# Patient Record
Sex: Female | Born: 1970 | Race: White | Hispanic: No | Smoking: Current some day smoker
Health system: Southern US, Community
[De-identification: ages and names within clinical notes are randomized; demographics above are authoritative.]

## PROBLEM LIST (undated history)

## (undated) DIAGNOSIS — F419 Anxiety disorder, unspecified: Secondary | ICD-10-CM

## (undated) DIAGNOSIS — N9089 Other specified noninflammatory disorders of vulva and perineum: Secondary | ICD-10-CM

## (undated) DIAGNOSIS — K5900 Constipation, unspecified: Secondary | ICD-10-CM

## (undated) DIAGNOSIS — E049 Nontoxic goiter, unspecified: Secondary | ICD-10-CM

## (undated) DIAGNOSIS — E282 Polycystic ovarian syndrome: Secondary | ICD-10-CM

## (undated) DIAGNOSIS — E119 Type 2 diabetes mellitus without complications: Secondary | ICD-10-CM

## (undated) DIAGNOSIS — I10 Essential (primary) hypertension: Secondary | ICD-10-CM

## (undated) HISTORY — DX: Other specified noninflammatory disorders of vulva and perineum: N90.89

## (undated) HISTORY — DX: Essential (primary) hypertension: I10

## (undated) HISTORY — DX: Anxiety disorder, unspecified: F41.9

## (undated) HISTORY — DX: Constipation, unspecified: K59.00

## (undated) HISTORY — DX: Nontoxic goiter, unspecified: E04.9

## (undated) HISTORY — PX: CHOLECYSTECTOMY: SHX55

## (undated) HISTORY — PX: COLONOSCOPY: SHX174

## (undated) HISTORY — PX: OTHER SURGICAL HISTORY: SHX169

## (undated) HISTORY — DX: Type 2 diabetes mellitus without complications: E11.9

---

## 2003-06-26 ENCOUNTER — Other Ambulatory Visit: Admission: RE | Admit: 2003-06-26 | Discharge: 2003-06-26 | Payer: Self-pay | Admitting: Family Medicine

## 2004-11-19 ENCOUNTER — Ambulatory Visit (HOSPITAL_COMMUNITY): Admission: RE | Admit: 2004-11-19 | Discharge: 2004-11-19 | Payer: Self-pay | Admitting: Endocrinology

## 2006-01-09 ENCOUNTER — Ambulatory Visit (HOSPITAL_COMMUNITY): Admission: RE | Admit: 2006-01-09 | Discharge: 2006-01-09 | Payer: Self-pay | Admitting: Endocrinology

## 2006-02-26 ENCOUNTER — Other Ambulatory Visit: Admission: RE | Admit: 2006-02-26 | Discharge: 2006-02-26 | Payer: Self-pay | Admitting: Family Medicine

## 2012-01-10 ENCOUNTER — Emergency Department (HOSPITAL_COMMUNITY)
Admission: EM | Admit: 2012-01-10 | Discharge: 2012-01-10 | Disposition: A | Discharge status: Home / Self Care | Payer: Self-pay | Attending: Emergency Medicine | Admitting: Emergency Medicine

## 2012-01-10 ENCOUNTER — Encounter (HOSPITAL_COMMUNITY): Payer: Self-pay

## 2012-01-10 DIAGNOSIS — H579 Unspecified disorder of eye and adnexa: Secondary | ICD-10-CM | POA: Insufficient documentation

## 2012-01-10 DIAGNOSIS — R220 Localized swelling, mass and lump, head: Secondary | ICD-10-CM | POA: Insufficient documentation

## 2012-01-10 DIAGNOSIS — R599 Enlarged lymph nodes, unspecified: Secondary | ICD-10-CM | POA: Insufficient documentation

## 2012-01-10 DIAGNOSIS — J329 Chronic sinusitis, unspecified: Secondary | ICD-10-CM | POA: Insufficient documentation

## 2012-01-10 DIAGNOSIS — J3489 Other specified disorders of nose and nasal sinuses: Secondary | ICD-10-CM | POA: Insufficient documentation

## 2012-01-10 DIAGNOSIS — H109 Unspecified conjunctivitis: Secondary | ICD-10-CM | POA: Insufficient documentation

## 2012-01-10 DIAGNOSIS — R221 Localized swelling, mass and lump, neck: Secondary | ICD-10-CM | POA: Insufficient documentation

## 2012-01-10 DIAGNOSIS — F17200 Nicotine dependence, unspecified, uncomplicated: Secondary | ICD-10-CM | POA: Insufficient documentation

## 2012-01-10 DIAGNOSIS — R21 Rash and other nonspecific skin eruption: Secondary | ICD-10-CM | POA: Insufficient documentation

## 2012-01-10 HISTORY — DX: Polycystic ovarian syndrome: E28.2

## 2012-01-10 MED ORDER — TOBRAMYCIN 0.3 % OP SOLN
2.0000 [drp] | Freq: Once | OPHTHALMIC | Status: AC
  Administered 2012-01-10: 2 [drp] via OPHTHALMIC
  Filled 2012-01-10: qty 5

## 2012-01-10 MED ORDER — TETRACAINE HCL 0.5 % OP SOLN
OPHTHALMIC | Status: AC
  Filled 2012-01-10: qty 2

## 2012-01-10 MED ORDER — AZITHROMYCIN 250 MG PO TABS: ORAL_TABLET | ORAL | Status: DC

## 2012-01-10 MED ORDER — HYDROCODONE-ACETAMINOPHEN 5-325 MG PO TABS: ORAL_TABLET | ORAL | Status: AC

## 2012-01-10 MED ORDER — TETRACAINE HCL 0.5 % OP SOLN
2.0000 [drp] | Freq: Once | OPHTHALMIC | Status: AC
  Administered 2012-01-10: 2 [drp] via OPHTHALMIC

## 2012-01-10 NOTE — ED Provider Notes (Signed)
History     CSN: [id number]  Arrival date & time 01/10/12  1620   First MD Initiated Contact with Patient 01/10/12 1639      Chief Complaint  Patient presents with  . Wound Check  . Sinusitis  . Eye Problem    (Consider location/radiation/quality/duration/timing/severity/associated sxs/prior treatment) Patient is a 41 y.o. female presenting with sinusitis. The history is provided by the patient. No language interpreter was used.  Sinusitis  This is a new problem. The current episode started more than 2 days ago. The problem has not changed since onset.There has been no fever. The pain is moderate. The pain has been constant since onset. Associated symptoms include congestion, sinus pressure and swollen glands. Pertinent negatives include no chills, no ear pain, no hoarse voice, no sore throat, no cough and no shortness of breath. Treatments tried: sudafed, sulfa antibiotic. The treatment provided no relief.    Past Medical History  Diagnosis Date  . Polycystic ovarian disease     Past Surgical History  Procedure Date  . Cholecystectomy     History reviewed. No pertinent family history.  History  Substance Use Topics  . Smoking status: Current Everyday Smoker    Types: Cigarettes  . Smokeless tobacco: Not on file  . Alcohol Use: No    OB History    Grav Para Term Preterm Abortions TAB SAB Ect Mult Living                  Review of Systems  Constitutional: Negative for chills, activity change and appetite change.  HENT: Positive for congestion and sinus pressure. Negative for ear pain, sore throat, hoarse voice, facial swelling, neck pain and neck stiffness.   Eyes: Positive for discharge, redness and itching.  Respiratory: Negative for cough, chest tightness and shortness of breath.   Gastrointestinal: Negative for nausea, vomiting, abdominal pain and diarrhea.  Genitourinary: Negative for difficulty urinating.  Skin: Positive for rash.  Neurological: Negative  for dizziness, weakness and numbness.  Hematological: Negative for adenopathy.  All other systems reviewed and are negative.    Allergies  Bactrim ds  Home Medications   Current Outpatient Rx  Name Route Sig Dispense Refill  . IBUPROFEN 200 MG PO TABS Oral Take 800 mg by mouth every 6 (six) hours as needed. pain    . SULFAMETHOXAZOLE-TMP DS 800-160 MG PO TABS Oral Take 1 tablet by mouth 2 (two) times daily.      BP 124/86  Pulse 88  Temp(Src) 98.1 F (36.7 C) (Oral)  Resp 18  Ht 5\' 3"  (1.6 m)  Wt 213 lb (96.616 kg)  BMI 37.73 kg/m2  SpO2 100%  LMP 11/09/2011  Physical Exam  Nursing note and vitals reviewed. Constitutional: She is oriented to person, place, and time. She appears well-developed and well-nourished. No distress.  HENT:  Head: Normocephalic and atraumatic. No trismus in the jaw.  Right Ear: Tympanic membrane and ear canal normal.  Left Ear: Tympanic membrane and ear canal normal.  Nose: Mucosal edema present. Right sinus exhibits frontal sinus tenderness. Left sinus exhibits maxillary sinus tenderness and frontal sinus tenderness.  Mouth/Throat: Uvula is midline, oropharynx is clear and moist and mucous membranes are normal. No uvula swelling.  Eyes: Conjunctivae and EOM are normal. Pupils are equal, round, and reactive to light. Right eye exhibits no chemosis and no exudate. Left eye exhibits exudate. Left eye exhibits no chemosis and no hordeolum. No foreign body present in the left eye. No scleral icterus.  Neck: Normal range of motion and phonation normal. Neck supple. No spinous process tenderness and no muscular tenderness present. No Brudzinski's sign and no Kernig's sign noted.  Cardiovascular: Normal rate, regular rhythm, normal heart sounds and intact distal pulses.   No murmur heard. Pulmonary/Chest: Effort normal and breath sounds normal.  Musculoskeletal: She exhibits no edema and no tenderness.  Lymphadenopathy:    She has no cervical adenopathy.    Neurological: She is alert and oriented to person, place, and time. She exhibits normal muscle tone. Coordination normal.  Skin: Skin is warm and intact. Rash noted. Rash is maculopapular.          Three circular, erythematous, target type lesions.  Two present on the right upper arm and one to the upper abdomen.  No vesicles, drainage or surrounding erythema    ED Course  Procedures (including critical care time)       MDM     Vitals are stable. Patient is nontoxic appearing, Alert and appears comfortable. 3 target type lesions to the trunk and right upper extremity are likely related to sulfa antibiotic.   Differential diagnosis would include erythema multiforma.  I have advised patient to discontinue the Bactrim, she agrees to close followup with her primary care physician or to return here for worsening symptoms.  Patient / Family / Caregiver understand and agree with initial ED impression and plan with expectations set for ED visit. Pt stable in ED with no significant deterioration in condition.      Bartlomiej Jenkinson L. Jodiann Ognibene, [place] 01/13/12 1528

## 2012-01-10 NOTE — ED Notes (Signed)
1. Has 3 ?bites to ab area and arm  first noted this am 2. Had sinus pain earlier this week, now having headache from sinus pressure, taking someone elses antibiotic 3.left eye swollen for 2 days, thinks may be "pink eye"

## 2012-01-10 NOTE — ED Notes (Signed)
Pt a/ox4. Resp even and unlabored. NAD at this time. D/C instructions reviewed with pt. Pt verbalized understanding. Pt ambulated to lobby with steady gate.  

## 2012-01-13 NOTE — ED Provider Notes (Signed)
Medical screening examination/treatment/procedure(s) were performed by non-physician practitioner and as supervising physician I was immediately available for consultation/collaboration.  Tanise Russman, MD 01/13/12 2119 

## 2012-02-04 ENCOUNTER — Other Ambulatory Visit (HOSPITAL_COMMUNITY): Payer: Self-pay | Admitting: Family Medicine

## 2012-02-04 DIAGNOSIS — E049 Nontoxic goiter, unspecified: Secondary | ICD-10-CM

## 2012-02-12 ENCOUNTER — Other Ambulatory Visit (HOSPITAL_COMMUNITY): Payer: Self-pay

## 2013-03-29 ENCOUNTER — Encounter: Payer: Self-pay | Admitting: *Deleted

## 2013-03-29 DIAGNOSIS — F419 Anxiety disorder, unspecified: Secondary | ICD-10-CM | POA: Insufficient documentation

## 2013-06-07 ENCOUNTER — Ambulatory Visit (INDEPENDENT_AMBULATORY_CARE_PROVIDER_SITE_OTHER): Payer: Medicaid Other | Admitting: Adult Health

## 2013-06-07 ENCOUNTER — Encounter: Payer: Self-pay | Admitting: Adult Health

## 2013-06-07 VITALS — BP 142/96 | Ht 64.0 in | Wt 201.0 lb

## 2013-06-07 DIAGNOSIS — Z3200 Encounter for pregnancy test, result unknown: Secondary | ICD-10-CM

## 2013-06-07 DIAGNOSIS — Z3201 Encounter for pregnancy test, result positive: Secondary | ICD-10-CM

## 2013-06-07 LAB — POCT URINE PREGNANCY: Preg Test, Ur: POSITIVE

## 2013-06-20 ENCOUNTER — Encounter: Payer: Self-pay | Admitting: Adult Health

## 2013-06-20 ENCOUNTER — Ambulatory Visit (INDEPENDENT_AMBULATORY_CARE_PROVIDER_SITE_OTHER): Payer: Medicaid Other | Admitting: Adult Health

## 2013-06-20 VITALS — BP 136/90 | Ht 64.0 in | Wt 207.0 lb

## 2013-06-20 DIAGNOSIS — Z3202 Encounter for pregnancy test, result negative: Secondary | ICD-10-CM

## 2013-06-20 DIAGNOSIS — O039 Complete or unspecified spontaneous abortion without complication: Secondary | ICD-10-CM

## 2013-06-20 LAB — POCT URINE PREGNANCY: Preg Test, Ur: NEGATIVE

## 2013-06-20 NOTE — Progress Notes (Signed)
Subjective:     Patient ID: Catherine Graves, female   DOB: 22-Dec-1970, 42 y.o.   MRN: [id number]  HPI Garnell is a 42 year old white female in complaining of bleeding that started Saturday and continues some now, some cramps.She had a + pregnancy test a few weeks ago.  Review of Systems See HPI Reviewed past medical,surgical, social and family history. Reviewed medications and allergies.     Objective:   Physical Exam BP 136/90  Ht 5\' 4"  (1.626 m)  Wt 207 lb (93.895 kg)  BMI 35.51 kg/m2  LMP 05/08/2014Urine pregnancy test negative.talk only     Assessment:      Miscarriage    Plan:      Check CBC,CMP,TSH and QHCG, and ABORH, and follow up in am Keep 7/22 appt for now Take extra strength tylenol for pain  Review handout on miscarriage

## 2013-06-20 NOTE — Patient Instructions (Addendum)
Miscarriage A miscarriage is the sudden loss of an unborn baby (fetus) before the 20th week of pregnancy. Most miscarriages happen in the first 3 months of pregnancy. Sometimes, it happens before a woman even knows she is pregnant. A miscarriage is also called a "spontaneous miscarriage" or "early pregnancy loss." Having a miscarriage can be an emotional experience. Talk with your caregiver about any questions you may have about miscarrying, the grieving process, and your future pregnancy plans. CAUSES   Problems with the fetal chromosomes that make it impossible for the baby to develop normally. Problems with the baby's genes or chromosomes are most often the result of errors that occur, by chance, as the embryo divides and grows. The problems are not inherited from the parents.  Infection of the cervix or uterus.   Hormone problems.   Problems with the cervix, such as having an incompetent cervix. This is when the tissue in the cervix is not strong enough to hold the pregnancy.   Problems with the uterus, such as an abnormally shaped uterus, uterine fibroids, or congenital abnormalities.   Certain medical conditions.   Smoking, drinking alcohol, or taking illegal drugs.   Trauma.  Often, the cause of a miscarriage is unknown.  SYMPTOMS   Vaginal bleeding or spotting, with or without cramps or pain.  Pain or cramping in the abdomen or lower back.  Passing fluid, tissue, or blood clots from the vagina. DIAGNOSIS  Your caregiver will perform a physical exam. You may also have an ultrasound to confirm the miscarriage. Blood or urine tests may also be ordered. TREATMENT   Sometimes, treatment is not necessary if you naturally pass all the fetal tissue that was in the uterus. If some of the fetus or placenta remains in the body (incomplete miscarriage), tissue left behind may become infected and must be removed. Usually, a dilation and curettage (D and C) procedure is performed.  During a D and C procedure, the cervix is widened (dilated) and any remaining fetal or placental tissue is gently removed from the uterus.  Antibiotic medicines are prescribed if there is an infection. Other medicines may be given to reduce the size of the uterus (contract) if there is a lot of bleeding.  If you have Rh negative blood and your baby was Rh positive, you will need a Rh immunoglobulin shot. This shot will protect any future baby from having Rh blood problems in future pregnancies. HOME CARE INSTRUCTIONS   Your caregiver may order bed rest or may allow you to continue light activity. Resume activity as directed by your caregiver.  Have someone help with home and family responsibilities during this time.   Keep track of the number of sanitary pads you use each day and how soaked (saturated) they are. Write down this information.   Do not use tampons. Do not douche or have sexual intercourse until approved by your caregiver.   Only take over-the-counter or prescription medicines for pain or discomfort as directed by your caregiver.   Do not take aspirin. Aspirin can cause bleeding.   Keep all follow-up appointments with your caregiver.   If you or your partner have problems with grieving, talk to your caregiver or seek counseling to help cope with the pregnancy loss. Allow enough time to grieve before trying to get pregnant again.  SEEK IMMEDIATE MEDICAL CARE IF:   You have severe cramps or pain in your back or abdomen.  You have a fever.  You pass large blood clots (walnut-sized   or larger) ortissue from your vagina. Save any tissue for your caregiver to inspect.   Your bleeding increases.   You have a thick, bad-smelling vaginal discharge.  You become lightheaded, weak, or you faint.   You have chills.  MAKE SURE YOU:  Understand these instructions.  Will watch your condition.  Will get help right away if you are not doing well or get  worse. Document Released: 05/13/2001 Document Revised: 05/18/2012 Document Reviewed: 01/06/2012 [facility] Patient Information 2014 [place], [place]. Follow up labs in am Keep 7/22 appt for now

## 2013-06-21 ENCOUNTER — Telehealth: Payer: Self-pay | Admitting: Adult Health

## 2013-06-21 ENCOUNTER — Encounter: Payer: Self-pay | Admitting: Women's Health

## 2013-06-21 ENCOUNTER — Other Ambulatory Visit: Payer: Self-pay

## 2013-06-21 LAB — CBC
HCT: 42.8 % (ref 36.0–46.0)
Hemoglobin: 14 g/dL (ref 12.0–15.0)
MCH: 28.3 pg (ref 26.0–34.0)
MCHC: 32.7 g/dL (ref 30.0–36.0)
MCV: 86.6 fL (ref 78.0–100.0)
Platelets: 352 10*3/uL (ref 150–400)
RBC: 4.94 MIL/uL (ref 3.87–5.11)
RDW: 13.8 % (ref 11.5–15.5)
WBC: 7.8 10*3/uL (ref 4.0–10.5)

## 2013-06-21 LAB — COMPREHENSIVE METABOLIC PANEL
ALT: 20 U/L (ref 0–35)
AST: 18 U/L (ref 0–37)
Albumin: 4.5 g/dL (ref 3.5–5.2)
Alkaline Phosphatase: 55 U/L (ref 39–117)
BUN: 18 mg/dL (ref 6–23)
CO2: 30 mEq/L (ref 19–32)
Calcium: 10.1 mg/dL (ref 8.4–10.5)
Chloride: 104 mEq/L (ref 96–112)
Creat: 0.88 mg/dL (ref 0.50–1.10)
Glucose, Bld: 84 mg/dL (ref 70–99)
Potassium: 4.6 mEq/L (ref 3.5–5.3)
Sodium: 139 mEq/L (ref 135–145)
Total Bilirubin: 0.2 mg/dL — ABNORMAL LOW (ref 0.3–1.2)
Total Protein: 7.7 g/dL (ref 6.0–8.3)

## 2013-06-21 LAB — HCG, QUANTITATIVE, PREGNANCY: hCG, Beta Chain, Quant, S: <2 m[IU]/mL

## 2013-06-21 LAB — ABO AND RH: Rh Type: POSITIVE

## 2013-06-21 LAB — TSH: TSH: 0.267 u[IU]/mL — ABNORMAL LOW (ref 0.350–4.500)

## 2013-06-21 NOTE — Telephone Encounter (Signed)
Please review labs. TSH a little low. Thanks!!! [employer]

## 2013-06-22 NOTE — Telephone Encounter (Signed)
Cyril Mourning, NP spoke with pt. Quant less than 2. Appt cancelled. JSY

## 2014-01-19 ENCOUNTER — Other Ambulatory Visit (HOSPITAL_COMMUNITY): Payer: Self-pay | Admitting: Adult Health Nurse Practitioner

## 2014-01-19 DIAGNOSIS — E049 Nontoxic goiter, unspecified: Secondary | ICD-10-CM

## 2014-01-23 ENCOUNTER — Ambulatory Visit (HOSPITAL_COMMUNITY): Payer: BC Managed Care – PPO

## 2014-06-14 ENCOUNTER — Ambulatory Visit (HOSPITAL_COMMUNITY)
Admission: RE | Admit: 2014-06-14 | Discharge: 2014-06-14 | Disposition: A | Discharge status: Home / Self Care | Payer: BC Managed Care – PPO | Source: Ambulatory Visit | Attending: Adult Health Nurse Practitioner | Admitting: Adult Health Nurse Practitioner

## 2014-06-14 DIAGNOSIS — R1310 Dysphagia, unspecified: Secondary | ICD-10-CM | POA: Insufficient documentation

## 2014-06-14 DIAGNOSIS — E049 Nontoxic goiter, unspecified: Secondary | ICD-10-CM | POA: Insufficient documentation

## 2014-07-18 ENCOUNTER — Other Ambulatory Visit (HOSPITAL_COMMUNITY): Payer: Self-pay | Admitting: Endocrinology

## 2014-07-18 DIAGNOSIS — E049 Nontoxic goiter, unspecified: Secondary | ICD-10-CM

## 2014-07-27 ENCOUNTER — Ambulatory Visit (HOSPITAL_COMMUNITY): Payer: BC Managed Care – PPO

## 2014-10-02 ENCOUNTER — Encounter: Payer: Self-pay | Admitting: Adult Health

## 2015-08-31 ENCOUNTER — Other Ambulatory Visit (HOSPITAL_COMMUNITY): Payer: Self-pay | Admitting: Adult Health Nurse Practitioner

## 2015-08-31 DIAGNOSIS — Z1231 Encounter for screening mammogram for malignant neoplasm of breast: Secondary | ICD-10-CM

## 2015-09-12 ENCOUNTER — Ambulatory Visit (HOSPITAL_COMMUNITY)
Admission: RE | Admit: 2015-09-12 | Discharge: 2015-09-12 | Disposition: A | Discharge status: Home / Self Care | Payer: BLUE CROSS/BLUE SHIELD | Source: Ambulatory Visit | Attending: Adult Health Nurse Practitioner | Admitting: Adult Health Nurse Practitioner

## 2015-09-12 DIAGNOSIS — Z1231 Encounter for screening mammogram for malignant neoplasm of breast: Secondary | ICD-10-CM | POA: Insufficient documentation

## 2016-05-22 ENCOUNTER — Other Ambulatory Visit (HOSPITAL_COMMUNITY)
Admission: RE | Admit: 2016-05-22 | Discharge: 2016-05-22 | Disposition: A | Discharge status: Home / Self Care | Payer: BLUE CROSS/BLUE SHIELD | Source: Ambulatory Visit | Attending: Adult Health | Admitting: Adult Health

## 2016-05-22 ENCOUNTER — Ambulatory Visit (INDEPENDENT_AMBULATORY_CARE_PROVIDER_SITE_OTHER): Payer: BLUE CROSS/BLUE SHIELD | Admitting: Adult Health

## 2016-05-22 ENCOUNTER — Encounter: Payer: Self-pay | Admitting: Adult Health

## 2016-05-22 VITALS — BP 140/84 | HR 86 | Ht 64.0 in | Wt 235.0 lb

## 2016-05-22 DIAGNOSIS — Z01419 Encounter for gynecological examination (general) (routine) without abnormal findings: Secondary | ICD-10-CM

## 2016-05-22 DIAGNOSIS — Z1212 Encounter for screening for malignant neoplasm of rectum: Secondary | ICD-10-CM | POA: Diagnosis not present

## 2016-05-22 DIAGNOSIS — N9089 Other specified noninflammatory disorders of vulva and perineum: Secondary | ICD-10-CM

## 2016-05-22 DIAGNOSIS — E049 Nontoxic goiter, unspecified: Secondary | ICD-10-CM

## 2016-05-22 DIAGNOSIS — K5900 Constipation, unspecified: Secondary | ICD-10-CM

## 2016-05-22 DIAGNOSIS — Z113 Encounter for screening for infections with a predominantly sexual mode of transmission: Secondary | ICD-10-CM | POA: Diagnosis present

## 2016-05-22 DIAGNOSIS — Z1151 Encounter for screening for human papillomavirus (HPV): Secondary | ICD-10-CM | POA: Insufficient documentation

## 2016-05-22 HISTORY — DX: Other specified noninflammatory disorders of vulva and perineum: N90.89

## 2016-05-22 HISTORY — DX: Nontoxic goiter, unspecified: E04.9

## 2016-05-22 HISTORY — DX: Constipation, unspecified: K59.00

## 2016-05-22 LAB — HEMOCCULT GUIAC POC 1CARD (OFFICE): Fecal Occult Blood, POC: NEGATIVE

## 2016-05-22 MED ORDER — NYSTATIN-TRIAMCINOLONE 100000-0.1 UNIT/GM-% EX CREA: 1.0000 "application " | TOPICAL_CREAM | Freq: Two times a day (BID) | CUTANEOUS | Status: DC

## 2016-05-22 MED ORDER — DOCUSATE SODIUM 100 MG PO CAPS: 100.0000 mg | ORAL_CAPSULE | Freq: Two times a day (BID) | ORAL | Status: DC

## 2016-05-22 NOTE — Patient Instructions (Signed)
Physical in 1 year, pap in 3 if normal Mammogram yearly Labs with PCP  

## 2016-05-22 NOTE — Progress Notes (Signed)
Patient ID: Catherine Graves, female   DOB: [birth year], 45 y.o.   MRN: [id number] History of Present Illness: Catherine Graves is a 45 year old white female, in for a well woman gyn exam and pap. PCP is Dr Joyce CopaNyland's office, [place] sees [place].   Current Medications, Allergies, Past Medical History, Past Surgical History, Family History and Social History were reviewed in [facility] electronic medical record.     Review of Systems: Patient denies any headaches, hearing loss, fatigue, blurred vision, shortness of breath, chest pain, abdominal pain, problems with urination, or intercourse. No joint pain or mood swings. Her Bowel movements are like pebbles.   Physical Exam:BP 140/84 mmHg  Pulse 86  Ht 5\' 4"  (1.626 m)  Wt 235 lb (106.595 kg)  BMI 40.32 kg/m2  LMP 05/01/2016 (Approximate) General:  Well developed, well nourished, no acute distress Skin:  Warm and dry Neck:  Midline trachea,thyroid is diffusely enlarged, with known goiter, at least 10+ cm, (she has had recent US),good ROM, no lymphadenopathy Lungs; Clear to auscultation bilaterally Breast:  No dominant palpable mass, retraction, or nipple discharge Cardiovascular: Regular rate and rhythm Abdomen:  Soft, non tender, no hepatosplenomegaly,obese Pelvic:  External genitalia has moisture changes and irritation.  The vagina is normal in appearance. Urethra has no lesions or masses. The cervix is smooth, pap with HPV performed.  Uterus is felt to be normal size, shape, and contour.  No adnexal masses or tenderness noted.Bladder is non tender, no masses felt. Rectal: Good sphincter tone, no polyps, or hemorrhoids felt.  Hemoccult negative. Extremities/musculoskeletal:  No swelling or varicosities noted, no clubbing or cyanosis Psych:  No mood changes, alert and cooperative,seems happy   Impression: Well woman gyn exam and pap Constipation  Vulva irritation Goiter    Plan: Try colace or other stool softener Rx mytrex cream to use 2-3  x daily to vulva, area prn, keep dry Physical in 1 year, pap in 3 if normal Mammogram yearly Call if BMs not better Labs with PCP and follow up with them about goiter

## 2016-05-23 LAB — CYTOLOGY - PAP

## 2016-11-12 ENCOUNTER — Other Ambulatory Visit (HOSPITAL_COMMUNITY): Payer: Self-pay | Admitting: Adult Health Nurse Practitioner

## 2016-11-12 DIAGNOSIS — Z1231 Encounter for screening mammogram for malignant neoplasm of breast: Secondary | ICD-10-CM

## 2016-11-20 ENCOUNTER — Ambulatory Visit (HOSPITAL_COMMUNITY)
Admission: RE | Admit: 2016-11-20 | Discharge: 2016-11-20 | Disposition: A | Discharge status: Home / Self Care | Payer: 59 | Source: Ambulatory Visit | Attending: Adult Health Nurse Practitioner | Admitting: Adult Health Nurse Practitioner

## 2016-11-20 DIAGNOSIS — Z1231 Encounter for screening mammogram for malignant neoplasm of breast: Secondary | ICD-10-CM | POA: Diagnosis present

## 2017-11-02 ENCOUNTER — Other Ambulatory Visit (HOSPITAL_COMMUNITY): Payer: Self-pay | Admitting: Adult Health Nurse Practitioner

## 2017-11-02 DIAGNOSIS — Z1231 Encounter for screening mammogram for malignant neoplasm of breast: Secondary | ICD-10-CM

## 2017-12-10 ENCOUNTER — Ambulatory Visit (INDEPENDENT_AMBULATORY_CARE_PROVIDER_SITE_OTHER): Payer: 59 | Admitting: Adult Health

## 2017-12-10 ENCOUNTER — Encounter: Payer: Self-pay | Admitting: Adult Health

## 2017-12-10 ENCOUNTER — Ambulatory Visit (HOSPITAL_COMMUNITY)
Admission: RE | Admit: 2017-12-10 | Discharge: 2017-12-10 | Disposition: A | Discharge status: Home / Self Care | Payer: 59 | Source: Ambulatory Visit | Attending: Adult Health Nurse Practitioner | Admitting: Adult Health Nurse Practitioner

## 2017-12-10 VITALS — BP 142/88 | HR 90 | Ht 64.5 in | Wt 233.0 lb

## 2017-12-10 DIAGNOSIS — Z1211 Encounter for screening for malignant neoplasm of colon: Secondary | ICD-10-CM | POA: Diagnosis not present

## 2017-12-10 DIAGNOSIS — Z1212 Encounter for screening for malignant neoplasm of rectum: Secondary | ICD-10-CM | POA: Diagnosis not present

## 2017-12-10 DIAGNOSIS — Z01411 Encounter for gynecological examination (general) (routine) with abnormal findings: Secondary | ICD-10-CM | POA: Diagnosis not present

## 2017-12-10 DIAGNOSIS — K649 Unspecified hemorrhoids: Secondary | ICD-10-CM

## 2017-12-10 DIAGNOSIS — Z1231 Encounter for screening mammogram for malignant neoplasm of breast: Secondary | ICD-10-CM | POA: Diagnosis present

## 2017-12-10 DIAGNOSIS — Z01419 Encounter for gynecological examination (general) (routine) without abnormal findings: Secondary | ICD-10-CM

## 2017-12-10 LAB — HEMOCCULT GUIAC POC 1CARD (OFFICE): Fecal Occult Blood, POC: NEGATIVE

## 2017-12-10 NOTE — Progress Notes (Signed)
Patient ID: Catherine Graves, female   DOB: [birth year], 47 y.o.   MRN: [id number] History of Present Illness:  Catherine Graves is s 47 year old white female,divorced, in for well woman gyn exam,she had normal pap with negative HPV 05/22/16.  PCP is Catherine [place] Hemberg, NP.   Current Medications, Allergies, Past Medical History, Past Surgical History, Family History and Social History were reviewed in [facility] electronic medical record.     Review of Systems: Patient denies any headaches, hearing loss, fatigue, blurred vision, shortness of breath, chest pain, abdominal pain, problems with bowel movements, urination, or intercourse. No joint pain or mood swings.    Physical Exam:BP (!) 142/88 (BP Location: Left Arm, Patient Position: Sitting, Cuff Size: Large)   Pulse 90   Ht 5' 4.5" (1.638 m)   Wt 233 lb (105.7 kg)   LMP 11/26/2017   BMI 39.38 kg/m  General:  Well developed, well nourished, no acute distress Skin:  Warm and dry Neck:  Midline trachea,  Thyroid absent, good ROM, no lymphadenopathy Lungs; Clear to auscultation bilaterally Breast:  No dominant palpable mass, retraction, or nipple discharge Cardiovascular: Regular rate and rhythm Abdomen:  Soft, non tender, no hepatosplenomegaly Pelvic:  External genitalia is normal in appearance, no lesions.  The vagina is normal in appearance. Urethra has no lesions or masses. The cervix is smooth.  Uterus is felt to be normal size, shape, and contour.  No adnexal masses or tenderness noted.Bladder is non tender, no masses felt. Rectal: Good sphincter tone, no polyps, + hemorrhoids felt.  Hemoccult negative. Extremities/musculoskeletal:  No swelling or varicosities noted, no clubbing or cyanosis Psych:  No mood changes, alert and cooperative,seems happy PHQ 2 score 0.  Impression: 1. Encounter for well woman exam with routine gynecological exam   2. Screening for colorectal cancer   3. Hemorrhoids, unspecified hemorrhoid type        Plan: Pap and physical in 1 year Had mammogram today, and get yearly Labs with PCP Colonoscopy at 51

## 2019-02-01 ENCOUNTER — Other Ambulatory Visit (HOSPITAL_COMMUNITY): Payer: Self-pay | Admitting: Adult Health Nurse Practitioner

## 2019-02-01 DIAGNOSIS — Z1231 Encounter for screening mammogram for malignant neoplasm of breast: Secondary | ICD-10-CM

## 2019-02-04 ENCOUNTER — Ambulatory Visit (HOSPITAL_COMMUNITY)
Admission: RE | Admit: 2019-02-04 | Discharge: 2019-02-04 | Disposition: A | Discharge status: Home / Self Care | Payer: BLUE CROSS/BLUE SHIELD | Source: Ambulatory Visit | Attending: Adult Health Nurse Practitioner | Admitting: Adult Health Nurse Practitioner

## 2019-02-04 DIAGNOSIS — Z1231 Encounter for screening mammogram for malignant neoplasm of breast: Secondary | ICD-10-CM

## 2019-03-18 ENCOUNTER — Other Ambulatory Visit: Payer: 59 | Admitting: Adult Health

## 2019-05-13 ENCOUNTER — Other Ambulatory Visit: Payer: BLUE CROSS/BLUE SHIELD | Admitting: Adult Health

## 2019-10-07 ENCOUNTER — Ambulatory Visit (INDEPENDENT_AMBULATORY_CARE_PROVIDER_SITE_OTHER): Payer: BC Managed Care – PPO | Admitting: Adult Health

## 2019-10-07 ENCOUNTER — Other Ambulatory Visit (HOSPITAL_COMMUNITY)
Admission: RE | Admit: 2019-10-07 | Discharge: 2019-10-07 | Disposition: A | Discharge status: Home / Self Care | Payer: BC Managed Care – PPO | Source: Ambulatory Visit | Attending: Adult Health | Admitting: Adult Health

## 2019-10-07 ENCOUNTER — Other Ambulatory Visit: Payer: Self-pay

## 2019-10-07 ENCOUNTER — Encounter: Payer: Self-pay | Admitting: Adult Health

## 2019-10-07 VITALS — BP 98/65 | HR 73 | Ht 63.0 in | Wt 234.0 lb

## 2019-10-07 DIAGNOSIS — Z1211 Encounter for screening for malignant neoplasm of colon: Secondary | ICD-10-CM | POA: Diagnosis not present

## 2019-10-07 DIAGNOSIS — K649 Unspecified hemorrhoids: Secondary | ICD-10-CM

## 2019-10-07 DIAGNOSIS — Z01419 Encounter for gynecological examination (general) (routine) without abnormal findings: Secondary | ICD-10-CM | POA: Insufficient documentation

## 2019-10-07 DIAGNOSIS — Z1212 Encounter for screening for malignant neoplasm of rectum: Secondary | ICD-10-CM

## 2019-10-07 LAB — HEMOCCULT GUIAC POC 1CARD (OFFICE): Fecal Occult Blood, POC: NEGATIVE

## 2019-10-07 NOTE — Progress Notes (Signed)
Patient ID: Catherine Graves, female   DOB: 01-12-1971, 48 y.o.   MRN: [id number] History of Present Illness: Catherine Graves is a 48 year old white female, divorced, has partner,[id number], in for a well woman gyn exam and pap. PCP is Dr Joline Salt.   Current Medications, Allergies, Past Medical History, Past Surgical History, Family History and Social History were reviewed in [employer] record.     Review of Systems: Patient denies any headaches, hearing loss, fatigue, blurred vision, shortness of breath, chest pain, abdominal pain, problems with bowel movements, urination, or intercourse. No joint pain or mood swings.    Physical Exam:BP 98/65 (BP Location: Left Arm, Patient Position: Sitting, Cuff Size: Large)   Pulse 73   Ht 5\' 3"  (1.6 m)   Wt 234 lb (106.1 kg)   LMP 09/16/2019 (Exact Date)   BMI 41.45 kg/m  General:  Well developed, well nourished, no acute distress Skin:  Warm and dry Neck:  Midline trachea, thyroid surgically removed, has scar, good ROM, no lymphadenopathy Lungs; Clear to auscultation bilaterally Breast:  No dominant palpable mass, retraction, or nipple discharge Cardiovascular: Regular rate and rhythm Abdomen:  Soft, non tender, no hepatosplenomegaly Pelvic:  External genitalia is normal in appearance, no lesions.  The vagina is normal in appearance. Urethra has no lesions or masses. The cervix is smooth, pap with high risk HPV 16/18 genotyping performed.  Uterus is felt to be normal size, shape, and contour.  No adnexal masses or tenderness noted.Bladder is non tender, no masses felt. Rectal: Good sphincter tone, no polyps, +external  hemorrhoids vs polyp noted, she wants them removed. Hemoccult negative. Extremities/musculoskeletal:  No swelling or varicosities noted, no clubbing or cyanosis Psych:  No mood changes, alert and cooperative,seems happy Fall risk is low PHQ 2 score is 0 Examination chaperoned by Rolena Infante LPN  Impression and  Plan:  1. Encounter for gynecological examination with Papanicolaou smear of cervix Pap sent Physical in 1 year Pap in 3 if normal Labs with PCP  Mammogram yearly   2. Screening for colorectal cancer Colonoscopy per GI   3. Hemorrhoids, unspecified hemorrhoid type Referred to Dr Constance Haw

## 2019-10-13 LAB — CYTOLOGY - PAP
Comment: NEGATIVE
Diagnosis: NEGATIVE
High risk HPV: NEGATIVE

## 2019-12-22 ENCOUNTER — Encounter: Payer: Self-pay | Admitting: General Surgery

## 2019-12-22 ENCOUNTER — Ambulatory Visit: Payer: BC Managed Care – PPO | Admitting: General Surgery

## 2019-12-22 ENCOUNTER — Other Ambulatory Visit: Payer: Self-pay

## 2019-12-22 VITALS — BP 126/83 | HR 97 | Temp 98.1°F | Resp 16 | Ht 63.0 in | Wt 231.0 lb

## 2019-12-22 DIAGNOSIS — K649 Unspecified hemorrhoids: Secondary | ICD-10-CM | POA: Diagnosis not present

## 2019-12-22 DIAGNOSIS — K921 Melena: Secondary | ICD-10-CM

## 2019-12-22 DIAGNOSIS — K644 Residual hemorrhoidal skin tags: Secondary | ICD-10-CM

## 2019-12-22 NOTE — Patient Instructions (Signed)
Hemorrhoids Hemorrhoids are swollen veins that may develop:  In the butt (rectum). These are called internal hemorrhoids.  Around the opening of the butt (anus). These are called external hemorrhoids. Hemorrhoids can cause pain, itching, or bleeding. Most of the time, they do not cause serious problems. They usually get better with diet changes, lifestyle changes, and other home treatments. What are the causes? This condition may be caused by:  Having trouble pooping (constipation).  Pushing hard (straining) to poop.  Watery poop (diarrhea).  Pregnancy.  Being very overweight (obese).  Sitting for long periods of time.  Heavy lifting or other activity that causes you to strain.  Anal sex.  Riding a bike for a long period of time. What are the signs or symptoms? Symptoms of this condition include:  Pain.  Itching or soreness in the butt.  Bleeding from the butt.  Leaking poop.  Swelling in the area.  One or more lumps around the opening of your butt. How is this diagnosed? A doctor can often diagnose this condition by looking at the affected area. The doctor may also:  Do an exam that involves feeling the area with a gloved hand (digital rectal exam).  Examine the area inside your butt using a small tube (anoscope).  Order blood tests. This may be done if you have lost a lot of blood.  Have you get a test that involves looking inside the colon using a flexible tube with a camera on the end (sigmoidoscopy or colonoscopy). How is this treated? This condition can usually be treated at home. Your doctor may tell you to change what you eat, make lifestyle changes, or try home treatments. If these do not help, procedures can be done to remove the hemorrhoids or make them smaller. These may involve:  Placing rubber bands at the base of the hemorrhoids to cut off their blood supply.  Injecting medicine into the hemorrhoids to shrink them.  Shining a type of light  energy onto the hemorrhoids to cause them to fall off.  Doing surgery to remove the hemorrhoids or cut off their blood supply. Follow these instructions at home: Eating and drinking   Eat foods that have a lot of fiber in them. These include whole grains, beans, nuts, fruits, and vegetables.  Ask your doctor about taking products that have added fiber (fibersupplements).  Reduce the amount of fat in your diet. You can do this by: ? Eating low-fat dairy products. ? Eating less red meat. ? Avoiding processed foods.  Drink enough fluid to keep your pee (urine) pale yellow. Managing pain and swelling   Take a warm-water bath (sitz bath) for 20 minutes to ease pain. Do this 3-4 times a day. You may do this in a bathtub or using a portable sitz bath that fits over the toilet.  If told, put ice on the painful area. It may be helpful to use ice between your warm baths. ? Put ice in a plastic bag. ? Place a towel between your skin and the bag. ? Leave the ice on for 20 minutes, 2-3 times a day. General instructions  Take over-the-counter and prescription medicines only as told by your doctor. ? Medicated creams and medicines may be used as told.  Exercise often. Ask your doctor how much and what kind of exercise is best for you.  Go to the bathroom when you have the urge to poop. Do not wait.  Avoid pushing too hard when you poop.  Keep your   butt dry and clean. Use wet toilet paper or moist towelettes after pooping.  Do not sit on the toilet for a long time.  Keep all follow-up visits as told by your doctor. This is important. Contact a doctor if you:  Have pain and swelling that do not get better with treatment or medicine.  Have trouble pooping.  Cannot poop.  Have pain or swelling outside the area of the hemorrhoids. Get help right away if you have:  Bleeding that will not stop. Summary  Hemorrhoids are swollen veins in the butt or around the opening of the  butt.  They can cause pain, itching, or bleeding.  Eat foods that have a lot of fiber in them. These include whole grains, beans, nuts, fruits, and vegetables.  Take a warm-water bath (sitz bath) for 20 minutes to ease pain. Do this 3-4 times a day. This information is not intended to replace advice given to you by your health care provider. Make sure you discuss any questions you have with your health care provider. Document Revised: 11/25/2018 Document Reviewed: 04/08/2018 Elsevier Patient Education  2020 [employer]   Rectal Bleeding  Rectal bleeding is when blood comes out of the opening of the butt (anus). People with this kind of bleeding may notice bright red blood in their underwear or in the toilet after they poop (have a bowel movement). They may also have dark red or black poop (stool). Rectal bleeding is often a sign that something is wrong. It needs to be checked by a doctor. Follow these instructions at home: Watch for any changes in your condition. Take these actions to help with bleeding and discomfort:  Eat a diet that is high in fiber. This will keep your poop soft so it is easier for you to poop without pushing too hard. Ask your doctor to tell you what foods and drinks are high in fiber.  Drink enough fluid to keep your pee (urine) clear or pale yellow. This also helps keep your poop soft.  Try taking a warm bath. This may help with pain.  Keep all follow-up visits as told by your doctor. This is important. Get help right away if:  You have new bleeding.  You have more bleeding than before.  You have black or dark red poop.  You throw up (vomit) blood or something that looks like coffee grounds.  You have pain or tenderness in your belly (abdomen).  You have a fever.  You feel weak.  You feel sick to your stomach (nauseous).  You pass out (faint).  You have very bad pain in your butt.  You cannot poop. This information is not intended to replace  advice given to you by your health care provider. Make sure you discuss any questions you have with your health care provider. Docu  Surgical Procedures for Hemorrhoids Surgical procedures can be used to treat hemorrhoids. Hemorrhoids are swollen veins that are inside the rectum (internal hemorrhoids) or around the anus (external hemorrhoids). They are caused by increased pressure in the anal area. This pressure may result from straining to have a bowel movement (constipation), diarrhea, pregnancy, obesity, or sitting for long periods of time. Hemorrhoids can cause symptoms such as pain and bleeding. Surgery may be needed if diet changes, lifestyle changes, and other treatments do not help your symptoms. Common surgical methods that may be used include:  Closed hemorrhoidectomy. The hemorrhoids are surgically removed, and the incisions are closed with stitches (sutures).  Open hemorrhoidectomy.  The hemorrhoids are surgically removed, but the incisions are allowed to heal without sutures.  Stapled hemorrhoidectomy. The hemorrhoids are partially removed, and the incisions are closed with staples. Tell a health care provider about:  Any allergies you have.  All medicines you are taking, including vitamins, herbs, eye drops, creams, and over-the-counter medicines.  Any problems you or family members have had with anesthetic medicines.  Any blood disorders you have.  Any surgeries you have had.  Any medical conditions you have.  Whether you are pregnant or may be pregnant. What are the risks? Generally, this is a safe procedure. However, problems may occur, including:  Infection.  Bleeding.  Allergic reactions to medicines.  Damage to other structures or organs.  Pain.  Constipation.  Difficulty passing urine.  Narrowing of the anal canal (stenosis).  Difficulty controlling bowel movements (incontinence).  Recurring hemorrhoids.  A new passage (fistula) that forms between  the anus or rectum and another area. What happens before the procedure? Medicines  Ask your health care provider about: ? Changing or stopping your regular medicines. This is especially important if you are taking diabetes medicines or blood thinners. ? Taking medicines such as aspirin and ibuprofen. These medicines can thin your blood. Do not take these medicines unless your health care provider tells you to take them. ? Taking over-the-counter medicines, vitamins, herbs, and supplements.   General instructions  You may need to have a procedure to examine the inside of your colon with a scope (colonoscopy). Your health care provider may do this to make sure that there are no other causes for your bleeding or pain.  You may be instructed to take a laxative and an enema to clean out your colon before surgery (bowel prep). Carefully follow instructions from your health care provider about bowel prep.  Plan to have someone take you home from the hospital or clinic.  Plan to have a responsible adult care for you for at least 24 hours after you leave the hospital or clinic. This is important.  Ask your health care provider: ? How your surgery site will be marked. ? What steps will be taken to help prevent infection. These may include:  Washing skin with a germ-killing soap.  Taking antibiotic medicine. What happens during the procedure?  An IV will be inserted into one of your veins.  You will be given one or more of the following: ? A medicine to help you relax (sedative). ? A medicine to numb the area (local anesthetic). ? A medicine to make you fall asleep (general anesthetic). ? A medicine that is injected into an area of your body to numb everything below the injection site (regional anesthetic).  A lubricating jelly may be placed into your rectum.  Your surgeon will insert a short scope (anoscope) into your rectum to examine the hemorrhoids.  One of the following surgical  methods will be used to remove the hemorrhoids: ? Closed hemorrhoidectomy.  Your surgeon will use surgical instruments to open the tissue around the hemorrhoids.  The veins that supply the hemorrhoids will be tied off with a suture.  The hemorrhoids will be removed.  The tissue that surrounds the hemorrhoids will be closed with sutures that your body can absorb (absorbable sutures). ? Open hemorrhoidectomy.  The hemorrhoids will be removed with surgical instruments.  The incisions will be left open to heal without sutures. ? Stapled hemorrhoidectomy.  Your surgeon will use a circular stapling device to partially remove the hemorrhoids.  The device will be inserted into your anus. It will remove a circular ring of tissue that includes hemorrhoid tissue and some tissue above the hemorrhoids.  The staples in the device will close the edges of the tissue. This will cut off the blood supply to any remaining hemorrhoids and pull the tissue back into place. Each of these procedures may vary among health care providers and hospitals. What happens after the procedure?  Your blood pressure, heart rate, breathing rate, and blood oxygen level may be monitored until you leave the hospital or clinic.  You will be given pain medicine as needed.  Do not drive for 24 hours if you were given a sedative during your procedure. Summary  Surgery may be needed for hemorrhoids if diet changes, lifestyle changes, and other treatments do not help your symptoms.  There are three common methods of surgery that are used to treat hemorrhoids.  Follow instructions from your health care provider about taking medicines and about eating and drinking before the procedure.  You may be instructed to take a laxative and an enema to clean out your colon before surgery (bowel prep). This information is not intended to replace advice given to you by your health care provider. Make sure you discuss any questions you  have with your health care provider. Document Revised: 05/04/2019 Document Reviewed: 10/05/2018 Elsevier Patient Education  2020 [employer].  Colonoscopy, Adult A colonoscopy is a procedure to look at the entire large intestine. This procedure is done using a long, thin, flexible tube that has a camera on the end. You may have a colonoscopy:  As a part of normal colorectal screening.  If you have certain symptoms, such as: ? A low number of red blood cells in your blood (anemia). ? Diarrhea that does not go away. ? Pain in your abdomen. ? Blood in your stool. A colonoscopy can help screen for and diagnose medical problems, including:  Tumors.  Extra tissue that grows where mucus forms (polyps).  Inflammation.  Areas of bleeding. Tell your health care provider about:  Any allergies you have.  All medicines you are taking, including vitamins, herbs, eye drops, creams, and over-the-counter medicines.  Any problems you or family members have had with anesthetic medicines.  Any blood disorders you have.  Any surgeries you have had.  Any medical conditions you have.  Any problems you have had with having bowel movements.  Whether you are pregnant or may be pregnant. What are the risks? Generally, this is a safe procedure. However, problems may occur, including:  Bleeding.  Damage to your intestine.  Allergic reactions to medicines given during the procedure.  Infection. This is rare. What happens before the procedure? Eating and drinking restrictions Follow instructions from your health care provider about eating or drinking restrictions, which may include:  A few days before the procedure: ? Follow a low-fiber diet. ? Avoid nuts, seeds, dried fruit, raw fruits, and vegetables.  1-3 days before the procedure: ? Eat only gelatin dessert or ice pops. ? Drink only clear liquids, such as water, clear juice, clear broth or bouillon, black coffee or tea, or clear  soft drinks or sports drinks. ? Avoid liquids that contain red or purple dye.  The day of the procedure: ? Do not eat solid foods. You may continue to drink clear liquids until up to 2 hours before the procedure. ? Do not eat or drink anything starting 2 hours before the procedure, or within the time period  that your health care provider recommends. Bowel prep If you were prescribed a bowel prep to take by mouth (orally) to clean out your colon:  Take it as told by your health care provider. Starting the day before your procedure, you will need to drink a large amount of liquid medicine. The liquid will cause you to have many bowel movements of loose stool until your stool becomes almost clear or light green.  If your skin or the opening between the buttocks (anus) gets irritated from diarrhea, you may relieve the irritation using: ? Wipes with medicine in them, such as adult wet wipes with aloe and vitamin E. ? A product to soothe skin, such as petroleum jelly.  If you vomit while drinking the bowel prep: ? Take a break for up to 60 minutes. ? Begin the bowel prep again. ? Call your health care provider if you keep vomiting or you cannot take the bowel prep without vomiting.  To clean out your colon, you may also be given: ? Laxative medicines. These help you have a bowel movement. ? Instructions for enema use. An enema is liquid medicine injected into your rectum. Medicines Ask your health care provider about:  Changing or stopping your regular medicines or supplements. This is especially important if you are taking iron supplements, diabetes medicines, or blood thinners.  Taking medicines such as aspirin and ibuprofen. These medicines can thin your blood. Do not take these medicines unless your health care provider tells you to take them.  Taking over-the-counter medicines, vitamins, herbs, and supplements. General instructions  Ask your health care provider what steps will be  taken to help prevent infection. These may include washing skin with a germ-killing soap.  Plan to have someone take you home from the hospital or clinic. What happens during the procedure?   An IV will be inserted into one of your veins.  You may be given one or more of the following: ? A medicine to help you relax (sedative). ? A medicine to numb the area (local anesthetic). ? A medicine to make you fall asleep (general anesthetic). This is rarely needed.  You will lie on your side with your knees bent.  The tube will: ? Have oil or gel put on it (be lubricated). ? Be inserted into your anus. ? Be gently eased through all parts of your large intestine.  Air will be sent into your colon to keep it open. This may cause some pressure or cramping.  Images will be taken with the camera and will appear on a screen.  A small tissue sample may be removed to be looked at under a microscope (biopsy). The tissue may be sent to a lab for testing if any signs of problems are found.  If small polyps are found, they may be removed and checked for cancer cells.  When the procedure is finished, the tube will be removed. The procedure may vary among health care providers and hospitals. What happens after the procedure?  Your blood pressure, heart rate, breathing rate, and blood oxygen level will be monitored until you leave the hospital or clinic.  You may have a small amount of blood in your stool.  You may pass gas and have mild cramping or bloating in your abdomen. This is caused by the air that was used to open your colon during the exam.  Do not drive for 24 hours after the procedure.  It is up to you to get the results of  your procedure. Ask your health care provider, or the department that is doing the procedure, when your results will be ready. Summary  A colonoscopy is a procedure to look at the entire large intestine.  Follow instructions from your health care provider about  eating and drinking before the procedure.  If you were prescribed an oral bowel prep to clean out your colon, take it as told by your health care provider.  During the colonoscopy, a flexible tube with a camera on its end is inserted into the anus and then passed into the other parts of the large intestine. This information is not intended to replace advice given to you by your health care provider. Make sure you discuss any questions you have with your health care provider. Document Revised: 06/10/2019 Document Reviewed: 06/10/2019 Elsevier Patient Education  2020 [employer]. ment Revised: 10/30/2017 Document Reviewed: 01/13/2016 Elsevier Patient Education  2020 [employer]

## 2019-12-22 NOTE — Progress Notes (Signed)
[facility] History and Physical  Reason for Referral: Hemorrhoids  Referring Physician: Cyril Mourning, NP ([facility])   Chief Complaint    Hemorrhoids      Catherine Graves is a 49 y.o. female.  HPI: Ms. Catherine Graves is a very sweet 49 yo who reports that she has had some issue with bleeding from her rectum periodically for the last several months. She says that she has had hemorrhoids since a thyroid surgery when she became very constipated, and that since that time she has noted a small bulge around her anus. The bulge does not cause her any discomfort, itching or hygiene issues, and she mostly comes in today for complaints of bright red blood per rectum. She says it is not everyday and it mostly occurs with wiping.  She has never had a colonoscopy. She reports having bowel habits that range from going several times a day to being constipated. She does not regularly take any medication for her BMs.  She was referred for further evaluation. She has no known family history of any colon cancers.   Past Medical History:  Diagnosis Date  . Anxiety   . Constipation 05/22/2016  . Diabetes mellitus without complication (HCC)   . Goiter 05/22/2016  . Hypertension    chronic  . Polycystic ovarian disease   . Vulvar irritation 05/22/2016    Past Surgical History:  Procedure Laterality Date  . CHOLECYSTECTOMY    . thyroid removed      Family History  Problem Relation Age of Onset  . Cancer Mother   . Diabetes Mother   . Diabetes Other   . CAD Other   . Hypertension Other     Social History   Tobacco Use  . Smoking status: Current Every Day Smoker    Packs/day: 0.10    Years: 23.00    Pack years: 2.30    Types: Cigarettes  . Smokeless tobacco: Never Used  . Tobacco comment: smokes 1 cig daily  Substance Use Topics  . Alcohol use: Yes    Comment: very rarely  . Drug use: No    Medications: I have reviewed the patient's current  medications. Allergies as of 12/22/2019      Reactions   Sulfa Antibiotics Rash   Bactrim Ds [sulfamethoxazole-trimethoprim] Hives      Medication List       Accurate as of December 22, 2019  3:28 PM. If you have any questions, ask your nurse or doctor.        amLODipine 5 MG tablet Commonly known as: NORVASC Take 5 mg by mouth daily.   atorvastatin 20 MG tablet Commonly known as: LIPITOR TAKE (1) TABLET BY MOUTH AT BEDTIME.   cetirizine 10 MG tablet Commonly known as: ZYRTEC Take by mouth.   fluticasone 50 MCG/ACT nasal spray Commonly known as: FLONASE 1 spray by Both Nostrils route daily.   hydrochlorothiazide 12.5 MG capsule Commonly known as: MICROZIDE TAKE 1 CAPSULE BY MOUTH ONCE A DAY.   Jardiance 25 MG Tabs tablet Generic drug: empagliflozin Take by mouth.   levothyroxine 175 MCG tablet Commonly known as: SYNTHROID Take by mouth.   metFORMIN 500 MG 24 hr tablet Commonly known as: GLUCOPHAGE-XR TAKE 1 TABLET BY MOUTH DAILY   Vitamin D (Ergocalciferol) 1.25 MG (50000 UNIT) Caps capsule Commonly known as: DRISDOL Take 50,000 Units by mouth every 7 (seven) days.        ROS:  A comprehensive review of systems was negative  except for: Gastrointestinal: positive for constipation and hematochezia Endocrine: positive for diabetes  Blood pressure 126/83, pulse 97, temperature 98.1 F (36.7 C), temperature source Oral, resp. rate 16, height 5\' 3"  (1.6 m), weight 231 lb (104.8 kg), SpO2 96 %. Physical Exam Vitals reviewed.  Constitutional:      Appearance: Normal appearance.  HENT:     Head: Normocephalic and atraumatic.     Nose: Nose normal.     Mouth/Throat:     Mouth: Mucous membranes are moist.  Eyes:     Extraocular Movements: Extraocular movements intact.     Pupils: Pupils are equal, round, and reactive to light.  Cardiovascular:     Rate and Rhythm: Normal rate and regular rhythm.  Pulmonary:     Effort: Pulmonary effort is normal.      Breath sounds: Normal breath sounds.  Abdominal:     General: There is no distension.     Palpations: Abdomen is soft.     Tenderness: There is no abdominal tenderness.  Genitourinary:    Rectum: External hemorrhoid present. No tenderness. Normal anal tone.     Comments: Possible healed posterior fissure, left lateral hemorrhoidal tag, nontender, normal tone, no gross bleeding Musculoskeletal:        General: No swelling. Normal range of motion.     Cervical back: Normal range of motion. No rigidity.  Skin:    General: Skin is warm and dry.  Neurological:     General: No focal deficit present.     Mental Status: She is alert and oriented to person, place, and time.  Psychiatric:        Mood and Affect: Mood normal.        Behavior: Behavior normal.        Thought Content: Thought content normal.        Judgment: Judgment normal.     Results: None   Assessment & Plan:  Catherine Graves is a 49 y.o. female with hematochezia and what is likely internal hemorrhoids with an obvious external hemorrhoidal tag. We discussed her symptom of bleeding and this being her main concern. She is also nervous that something else could be going on and not just hemorrhoids.  She has never had a colonoscopy. We discussed with that with bright red blood she would qualify for colonoscopy to rule out any other sources of bleeding.  We discussed hemorrhoids and hemorrhoid surgery.  Hemorrhoid surgery for external hemorrhoids is very painful. The pain and discomfort that the patient is having currently will be magnified after the surgery for at least 2-3 weeks.  The patient will have feelings of constant pressure and pain in the area from the swelling and removal of the anoderm (skin around the anus). The internal hemorrhoids are not painful to remove because the same nerves are not involved, and the sensation is different, but removal of any external hemorrhoids will cause significant discomfort. They will  need at least 4-6 weeks to recover from the surgery, and should not expect to be able to feel back to "normal for 6-8 weeks."    The risk of hemorrhoid surgery include bleeding, risk of infection although rare, and the risk of narrowing the anal canal if too much tissue is removed. Given this risk, it is likely that only the 2 largest hemorrhoid columns would be removed during the initial surgery.  We have also discussed the risk of incontinence after surgery if the muscles were injured, and although this is rare that  it can happen and is another reason to limit the amount of hemorrhoids removed.    She is having no real symptoms of discomfort or hygiene issues. She is mostly worried about the bleeding. Referral to GI for colonoscopy. Discussed limited procedures due to COVID at the present time.  Will plan to see back in April to reassess her decision for hemorrhoidectomy and hopefully she will have had a colonoscopy.   All questions were answered to the satisfaction of the patient.   Lucretia Roers 12/22/2019, 3:28 PM

## 2020-01-16 ENCOUNTER — Encounter (INDEPENDENT_AMBULATORY_CARE_PROVIDER_SITE_OTHER): Payer: Self-pay | Admitting: Gastroenterology

## 2020-03-22 ENCOUNTER — Ambulatory Visit: Payer: BC Managed Care – PPO | Admitting: General Surgery

## 2020-04-17 ENCOUNTER — Other Ambulatory Visit (HOSPITAL_COMMUNITY): Payer: Self-pay | Admitting: Internal Medicine

## 2020-04-17 DIAGNOSIS — Z1231 Encounter for screening mammogram for malignant neoplasm of breast: Secondary | ICD-10-CM

## 2020-04-26 ENCOUNTER — Other Ambulatory Visit: Payer: Self-pay

## 2020-04-26 ENCOUNTER — Ambulatory Visit (HOSPITAL_COMMUNITY)
Admission: RE | Admit: 2020-04-26 | Discharge: 2020-04-26 | Disposition: A | Discharge status: Home / Self Care | Payer: BC Managed Care – PPO | Source: Ambulatory Visit | Attending: Internal Medicine | Admitting: Internal Medicine

## 2020-04-26 ENCOUNTER — Ambulatory Visit (HOSPITAL_COMMUNITY): Payer: BC Managed Care – PPO

## 2020-04-26 DIAGNOSIS — Z1231 Encounter for screening mammogram for malignant neoplasm of breast: Secondary | ICD-10-CM | POA: Insufficient documentation

## 2020-05-01 ENCOUNTER — Other Ambulatory Visit (HOSPITAL_COMMUNITY): Payer: Self-pay | Admitting: Internal Medicine

## 2020-05-02 ENCOUNTER — Other Ambulatory Visit (HOSPITAL_COMMUNITY): Payer: Self-pay | Admitting: Internal Medicine

## 2020-05-02 DIAGNOSIS — R928 Other abnormal and inconclusive findings on diagnostic imaging of breast: Secondary | ICD-10-CM

## 2020-05-07 ENCOUNTER — Ambulatory Visit (INDEPENDENT_AMBULATORY_CARE_PROVIDER_SITE_OTHER): Payer: BC Managed Care – PPO | Admitting: Gastroenterology

## 2020-05-22 ENCOUNTER — Other Ambulatory Visit: Payer: Self-pay

## 2020-05-22 ENCOUNTER — Ambulatory Visit (HOSPITAL_COMMUNITY)
Admission: RE | Admit: 2020-05-22 | Discharge: 2020-05-22 | Disposition: A | Discharge status: Home / Self Care | Payer: BC Managed Care – PPO | Source: Ambulatory Visit | Attending: Internal Medicine | Admitting: Internal Medicine

## 2020-05-22 DIAGNOSIS — R928 Other abnormal and inconclusive findings on diagnostic imaging of breast: Secondary | ICD-10-CM | POA: Insufficient documentation

## 2020-06-20 ENCOUNTER — Encounter (INDEPENDENT_AMBULATORY_CARE_PROVIDER_SITE_OTHER): Payer: Self-pay | Admitting: Gastroenterology

## 2020-07-05 ENCOUNTER — Ambulatory Visit (INDEPENDENT_AMBULATORY_CARE_PROVIDER_SITE_OTHER): Payer: BC Managed Care – PPO | Admitting: Gastroenterology

## 2021-04-08 ENCOUNTER — Other Ambulatory Visit (HOSPITAL_COMMUNITY): Payer: Self-pay | Admitting: Internal Medicine

## 2021-04-08 DIAGNOSIS — M7989 Other specified soft tissue disorders: Secondary | ICD-10-CM

## 2021-04-15 ENCOUNTER — Ambulatory Visit (HOSPITAL_COMMUNITY)
Admission: RE | Admit: 2021-04-15 | Discharge: 2021-04-15 | Disposition: A | Discharge status: Home / Self Care | Payer: BC Managed Care – PPO | Source: Ambulatory Visit | Attending: Internal Medicine | Admitting: Internal Medicine

## 2021-04-15 ENCOUNTER — Other Ambulatory Visit: Payer: Self-pay

## 2021-04-15 DIAGNOSIS — M7989 Other specified soft tissue disorders: Secondary | ICD-10-CM | POA: Diagnosis not present

## 2021-05-08 ENCOUNTER — Other Ambulatory Visit: Payer: Self-pay

## 2021-05-08 ENCOUNTER — Ambulatory Visit
Admission: EM | Admit: 2021-05-08 | Discharge: 2021-05-08 | Disposition: A | Discharge status: Home / Self Care | Payer: BC Managed Care – PPO | Attending: Family Medicine | Admitting: Family Medicine

## 2021-05-08 ENCOUNTER — Encounter: Payer: Self-pay | Admitting: Emergency Medicine

## 2021-05-08 DIAGNOSIS — L03116 Cellulitis of left lower limb: Secondary | ICD-10-CM | POA: Diagnosis not present

## 2021-05-08 MED ORDER — DOXYCYCLINE HYCLATE 100 MG PO CAPS
100.0000 mg | ORAL_CAPSULE | Freq: Two times a day (BID) | ORAL | 0 refills | Status: DC
Start: 2021-05-08 — End: 2022-11-19

## 2021-05-08 NOTE — Discharge Instructions (Addendum)
I have sent in doxycycline for you to take one tablet twice a day for 10 days  Wear compression socks   Follow up with this office or with primary care if symptoms are persisting.  Follow up in the ER for high fever, trouble swallowing, trouble breathing, other concerning symptoms.

## 2021-05-08 NOTE — ED Triage Notes (Signed)
Left lower leg swelling with a red spot on leg x 3 weeks.  Red area feels warm to the touch.  States 2 knots come up on the back side of leg about 2 months ago.  Knots are tender.  Elevating leg no longer helps with the swelling.  Was placed on a fluid pill a month ago and did not help.

## 2021-05-09 NOTE — ED Provider Notes (Signed)
[facility]    CSN: [id number] Arrival date & time: 05/08/21  1811      History   Chief Complaint No chief complaint on file.   HPI Catherine Graves is a 50 y.o. female.   Reports left leg swelling and redness for the last month. States that the swelling did improve in the mornings after sleep, but has not done so in the last 3 days. States that she has seen her PCP and had ultrasound to rule out DVT within the last 2 weeks. States that her PCP also tried a diuretic unsuccessfully about 2 weeks ago as well. Denies previous symptoms. Denies chills, body aches, radiating pain, red streaking, abdominal pain, nausea, diarrhea, vomiting, fever, other symptoms.  ROS per HPI  The history is provided by the patient.   Past Medical History:  Diagnosis Date   Anxiety    Constipation 05/22/2016   Diabetes mellitus without complication (HCC)    Goiter 05/22/2016   Hypertension    chronic   Polycystic ovarian disease    Vulvar irritation 05/22/2016    Patient Active Problem List   Diagnosis Date Noted   Hematochezia 12/22/2019   Hemorrhoidal skin tag 12/22/2019   Screening for colorectal cancer 10/07/2019   Encounter for gynecological examination with Papanicolaou smear of cervix 10/07/2019   Hemorrhoids 12/10/2017   Encounter for well woman exam with routine gynecological exam 12/10/2017   Constipation 05/22/2016   Vulvar irritation 05/22/2016   Goiter 05/22/2016   Miscarriage 06/20/2013   Anxiety 03/29/2013    Past Surgical History:  Procedure Laterality Date   CHOLECYSTECTOMY     thyroid removed      OB History     Gravida  1   Para      Term      Preterm      AB  1   Living         SAB  1   IAB      Ectopic      Multiple      Live Births               Home Medications    Prior to Admission medications   Medication Sig Start Date End Date Taking? Authorizing Provider  doxycycline (VIBRAMYCIN) 100 MG capsule Take 1 capsule (100  mg total) by mouth 2 (two) times daily. 05/08/21  Yes Moshe Cipro, NP  amLODipine (NORVASC) 5 MG tablet Take 5 mg by mouth daily.  03/25/16   [provider]  atorvastatin (LIPITOR) 20 MG tablet TAKE (1) TABLET BY MOUTH AT BEDTIME. 04/05/19   [provider]  cetirizine (ZYRTEC) 10 MG tablet Take by mouth.    [provider]  empagliflozin (JARDIANCE) 25 MG TABS tablet Take by mouth. 08/01/19   [provider]  fluticasone (FLONASE) 50 MCG/ACT nasal spray 1 spray by Both Nostrils route daily.    [provider]  hydrochlorothiazide (MICROZIDE) 12.5 MG capsule TAKE 1 CAPSULE BY MOUTH ONCE A DAY. 03/28/19   [provider]  levothyroxine (SYNTHROID) 175 MCG tablet Take by mouth. 08/18/19   [provider]  metFORMIN (GLUCOPHAGE-XR) 500 MG 24 hr tablet TAKE 1 TABLET BY MOUTH DAILY 03/03/19   [provider]  Vitamin D, Ergocalciferol, (DRISDOL) 50000 units CAPS capsule Take 50,000 Units by mouth every 7 (seven) days.    [provider]    Family History Family History  Problem Relation Age of Onset   Cancer Mother  Diabetes Mother    Diabetes Other    CAD Other    Hypertension Other     Social History Social History   Tobacco Use   Smoking status: Every Day    Packs/day: 0.10    Years: 23.00    Pack years: 2.30    Types: Cigarettes   Smokeless tobacco: Never   Tobacco comments:    smokes 1 cig daily  Substance Use Topics   Alcohol use: Yes    Comment: very rarely   Drug use: No     Allergies   Sulfa antibiotics and Bactrim ds [sulfamethoxazole-trimethoprim]   Review of Systems Review of Systems   Physical Exam Triage Vital Signs ED Triage Vitals  Enc Vitals Group     BP 05/08/21 1821 118/73     Pulse Rate 05/08/21 1821 (!) 112     Resp 05/08/21 1821 16     Temp 05/08/21 1821 98.5 F (36.9 C)     Temp Source 05/08/21 1821 Oral     SpO2 05/08/21 1821 96 %     Weight --      Height  --      Head Circumference --      Peak Flow --      Pain Score 05/08/21 1824 7     Pain Loc --      Pain Edu? --      Excl. in GC? --    No data found.  Updated Vital Signs BP 118/73 (BP Location: Right Arm)   Pulse (!) 112   Temp 98.5 F (36.9 C) (Oral)   Resp 16   LMP 04/08/2021   SpO2 96%   Visual Acuity Right Eye Distance:   Left Eye Distance:   Bilateral Distance:    Right Eye Near:   Left Eye Near:    Bilateral Near:     Physical Exam Vitals and nursing note reviewed.  Constitutional:      General: She is not in acute distress.    Appearance: She is well-developed. She is obese.  HENT:     Head: Normocephalic and atraumatic.     Nose: Nose normal.     Mouth/Throat:     Mouth: Mucous membranes are moist.     Pharynx: Oropharynx is clear.  Eyes:     Extraocular Movements: Extraocular movements intact.     Conjunctiva/sclera: Conjunctivae normal.     Pupils: Pupils are equal, round, and reactive to light.  Cardiovascular:     Rate and Rhythm: Normal rate and regular rhythm.  Pulmonary:     Effort: Pulmonary effort is normal. No respiratory distress.  Musculoskeletal:     Cervical back: Normal range of motion and neck supple.     Comments: See photo  Skin:    General: Skin is warm and dry.     Capillary Refill: Capillary refill takes less than 2 seconds.  Neurological:     General: No focal deficit present.     Mental Status: She is alert and oriented to person, place, and time.  Psychiatric:        Mood and Affect: Mood normal.        Behavior: Behavior normal.      UC Treatments / Results  Labs (all labs ordered are listed, but only abnormal results are displayed) Labs Reviewed - No data to display  EKG   Radiology No results found.  Procedures Procedures (including critical care time)  Medications Ordered in UC Medications -  No data to display  Initial Impression / Assessment and Plan / UC Course  I have reviewed the triage vital  signs and the nursing notes.  Pertinent labs & imaging results that were available during my care of the patient were reviewed by me and considered in my medical decision making (see chart for details).    Cellulitis of LLE  Prescribed doxycycline 100mg  BID x 7 days Follow up with this office or with primary care for increased swelling, redness, tenderness, warmth, drainage from the area. May also wear compression socks to help with the swelling, elevate the foot Follow up in the ER for red streaking up your leg, high fever, trouble swallowing, trouble breathing, other concerning symptoms.   Final Clinical Impressions(s) / UC Diagnoses   Final diagnoses:  Cellulitis of left lower extremity     Discharge Instructions      I have sent in doxycycline for you to take one tablet twice a day for 10 days  Wear compression socks   Follow up with this office or with primary care if symptoms are persisting.  Follow up in the ER for high fever, trouble swallowing, trouble breathing, other concerning symptoms.      ED Prescriptions     Medication Sig Dispense Auth. Provider   doxycycline (VIBRAMYCIN) 100 MG capsule Take 1 capsule (100 mg total) by mouth 2 (two) times daily. 14 capsule , NP      PDMP not reviewed this encounter.   Moshe Cipro, NP 05/09/21 1845

## 2021-10-11 ENCOUNTER — Other Ambulatory Visit (HOSPITAL_COMMUNITY): Payer: Self-pay | Admitting: Internal Medicine

## 2021-10-11 DIAGNOSIS — Z1231 Encounter for screening mammogram for malignant neoplasm of breast: Secondary | ICD-10-CM

## 2021-10-28 ENCOUNTER — Ambulatory Visit (HOSPITAL_COMMUNITY): Payer: BC Managed Care – PPO

## 2021-10-30 ENCOUNTER — Other Ambulatory Visit: Payer: Self-pay

## 2021-10-30 ENCOUNTER — Ambulatory Visit (HOSPITAL_COMMUNITY)
Admission: RE | Admit: 2021-10-30 | Discharge: 2021-10-30 | Disposition: A | Discharge status: Home / Self Care | Payer: BC Managed Care – PPO | Source: Ambulatory Visit | Attending: Internal Medicine | Admitting: Internal Medicine

## 2021-10-30 DIAGNOSIS — Z1231 Encounter for screening mammogram for malignant neoplasm of breast: Secondary | ICD-10-CM | POA: Insufficient documentation

## 2021-10-31 ENCOUNTER — Other Ambulatory Visit (HOSPITAL_COMMUNITY): Payer: Self-pay | Admitting: Internal Medicine

## 2021-10-31 DIAGNOSIS — R928 Other abnormal and inconclusive findings on diagnostic imaging of breast: Secondary | ICD-10-CM

## 2021-11-05 ENCOUNTER — Other Ambulatory Visit: Payer: Self-pay

## 2021-11-05 ENCOUNTER — Ambulatory Visit (HOSPITAL_COMMUNITY)
Admission: RE | Admit: 2021-11-05 | Discharge: 2021-11-05 | Disposition: A | Discharge status: Home / Self Care | Payer: BC Managed Care – PPO | Source: Ambulatory Visit | Attending: Internal Medicine | Admitting: Internal Medicine

## 2021-11-05 ENCOUNTER — Encounter (HOSPITAL_COMMUNITY): Payer: Self-pay

## 2021-11-05 DIAGNOSIS — R928 Other abnormal and inconclusive findings on diagnostic imaging of breast: Secondary | ICD-10-CM

## 2021-11-28 ENCOUNTER — Other Ambulatory Visit: Payer: Self-pay

## 2021-11-28 ENCOUNTER — Ambulatory Visit (INDEPENDENT_AMBULATORY_CARE_PROVIDER_SITE_OTHER): Payer: BC Managed Care – PPO | Admitting: Adult Health

## 2021-11-28 ENCOUNTER — Encounter: Payer: Self-pay | Admitting: Adult Health

## 2021-11-28 VITALS — BP 111/70 | HR 104 | Ht 63.0 in | Wt 239.0 lb

## 2021-11-28 DIAGNOSIS — R195 Other fecal abnormalities: Secondary | ICD-10-CM | POA: Insufficient documentation

## 2021-11-28 DIAGNOSIS — K649 Unspecified hemorrhoids: Secondary | ICD-10-CM

## 2021-11-28 DIAGNOSIS — Z01419 Encounter for gynecological examination (general) (routine) without abnormal findings: Secondary | ICD-10-CM

## 2021-11-28 DIAGNOSIS — Z1211 Encounter for screening for malignant neoplasm of colon: Secondary | ICD-10-CM | POA: Diagnosis not present

## 2021-11-28 LAB — HEMOCCULT GUIAC POC 1CARD (OFFICE): Fecal Occult Blood, POC: POSITIVE — AB

## 2021-11-28 NOTE — Progress Notes (Signed)
Patient ID: Catherine Graves, female   DOB: [birth year], 50 y.o.   MRN: [id number] History of Present Illness: Catherine Graves is a 50 year old white female, divorced, G1P0010, in for a well woman gyn exam. She works at [employer].  PCP is Gilman Schmidt, NP.  Lab Results  Component Value Date   DIAGPAP  10/07/2019    - Negative for intraepithelial lesion or malignancy (NILM)   HPVHIGH Negative 10/07/2019    Current Medications, Allergies, Past Medical History, Past Surgical History, Family History and Social History were reviewed in [employer] record.     Review of Systems: Patient denies any headaches, hearing loss, fatigue, blurred vision, shortness of breath, chest pain, abdominal pain, problems with  urination, or intercourse.(Not often).  No joint pain or mood swings.  She has had some rectal bleeding with BMs, She had rectal skin tags removed recently and has colonoscopy in February.  Still have periods.   Physical Exam:BP 111/70 (BP Location: Right Arm, Patient Position: Sitting, Cuff Size: Normal)    Pulse (!) 104    Ht 5\' 3"  (1.6 m)    Wt 239 lb (108.4 kg)    LMP 11/24/2021    BMI 42.34 kg/m   General:  Well developed, well nourished, no acute distress Skin:  Warm and dry Neck:  Midline trachea, normal thyroid, good ROM, no lymphadenopathy Lungs; Clear to auscultation bilaterally Breast:  No dominant palpable mass, retraction, or nipple discharge Cardiovascular: Regular rate and rhythm Abdomen:  Soft, non tender, no hepatosplenomegaly Pelvic:  External genitalia is normal in appearance, no lesions.  The vagina is normal in appearance. Urethra has no lesions or masses. The cervix is smooth.  Uterus is felt to be normal size, shape, and contour.  No adnexal masses or tenderness noted.Bladder is non tender, no masses felt. Rectal: Good sphincter tone, no polyps, + hemorrhoids felt.  Hemoccult positive. Extremities/musculoskeletal: no clubbing or  cyanosis,has mild swelling in BLE, has discoloration, left lower shin, seeing vein specialist, usually has on compression hose. Psych:  No mood changes, alert and cooperative,seems happy AA is 1 Fall risk is low Depression screen [facility] 2/9 11/28/2021 10/07/2019 12/10/2017  Decreased Interest 0 0 0  Down, Depressed, Hopeless 0 0 0  PHQ - 2 Score 0 0 0    GAD 7 : Generalized Anxiety Score 11/28/2021  Nervous, Anxious, on Edge 0  Control/stop worrying 0  Worry too much - different things 0  Trouble relaxing 0  Restless 0  Easily annoyed or irritable 1  Afraid - awful might happen 0  Total GAD 7 Score 1      Upstream - 11/28/21 1438       Pregnancy Intention Screening   Does the patient want to become pregnant in the next year? No    Does the patient's partner want to become pregnant in the next year? No    Would the patient like to discuss contraceptive options today? No      Contraception Wrap Up   Current Method No Contraceptive Precautions    End Method No Contraception Precautions    Contraception Counseling Provided No            Examination chaperoned by 11/30/21 LPN   Impression and Plan: 1. Encounter for well woman exam with routine gynecological exam Pap and physical in 1 year Mammogram yearly Labs with PCP  2. Encounter for screening fecal occult blood testing   3. Hemorrhoids, unspecified hemorrhoid type  4.  Fecal occult blood test positive Sent home with 3 hemoccult cards to do and return Has colonoscopy in February

## 2021-12-05 ENCOUNTER — Encounter: Payer: Self-pay | Admitting: *Deleted

## 2021-12-05 ENCOUNTER — Other Ambulatory Visit (INDEPENDENT_AMBULATORY_CARE_PROVIDER_SITE_OTHER): Payer: BC Managed Care – PPO

## 2021-12-05 DIAGNOSIS — R195 Other fecal abnormalities: Secondary | ICD-10-CM

## 2021-12-05 LAB — HEMOCCULT GUIAC POC 1CARD (OFFICE)
Card #2 Fecal Occult Blod, POC: NEGATIVE
Card #3 Fecal Occult Blood, POC: NEGATIVE
Fecal Occult Blood, POC: NEGATIVE

## 2021-12-05 NOTE — Progress Notes (Addendum)
Pt returned hemoccult cards. Results negative x3. Pt informed via [employer].

## 2021-12-05 NOTE — Progress Notes (Signed)
Chart reviewed for nurse visit. Agree with plan of care.  Estill Dooms, NP 12/05/2021 3:42 PM

## 2022-05-21 ENCOUNTER — Other Ambulatory Visit (HOSPITAL_COMMUNITY): Payer: Self-pay | Admitting: Internal Medicine

## 2022-05-26 ENCOUNTER — Other Ambulatory Visit (HOSPITAL_COMMUNITY): Payer: Self-pay | Admitting: Internal Medicine

## 2022-05-26 DIAGNOSIS — R928 Other abnormal and inconclusive findings on diagnostic imaging of breast: Secondary | ICD-10-CM

## 2022-06-10 ENCOUNTER — Ambulatory Visit (HOSPITAL_COMMUNITY)
Admission: RE | Admit: 2022-06-10 | Discharge: 2022-06-10 | Disposition: A | Discharge status: Home / Self Care | Payer: BC Managed Care – PPO | Source: Ambulatory Visit | Attending: Internal Medicine | Admitting: Internal Medicine

## 2022-06-10 ENCOUNTER — Encounter (HOSPITAL_COMMUNITY): Payer: Self-pay

## 2022-06-10 DIAGNOSIS — R928 Other abnormal and inconclusive findings on diagnostic imaging of breast: Secondary | ICD-10-CM

## 2022-06-11 ENCOUNTER — Other Ambulatory Visit (HOSPITAL_COMMUNITY): Payer: Self-pay | Admitting: Internal Medicine

## 2022-06-11 ENCOUNTER — Other Ambulatory Visit: Payer: Self-pay | Admitting: Internal Medicine

## 2022-06-11 DIAGNOSIS — R928 Other abnormal and inconclusive findings on diagnostic imaging of breast: Secondary | ICD-10-CM

## 2022-06-11 DIAGNOSIS — R921 Mammographic calcification found on diagnostic imaging of breast: Secondary | ICD-10-CM

## 2022-06-12 ENCOUNTER — Other Ambulatory Visit (HOSPITAL_COMMUNITY): Payer: BC Managed Care – PPO

## 2022-06-19 ENCOUNTER — Ambulatory Visit
Admission: RE | Admit: 2022-06-19 | Discharge: 2022-06-19 | Disposition: A | Discharge status: Home / Self Care | Payer: BC Managed Care – PPO | Source: Ambulatory Visit | Attending: Internal Medicine | Admitting: Internal Medicine

## 2022-06-19 ENCOUNTER — Other Ambulatory Visit: Payer: Self-pay | Admitting: Diagnostic Radiology

## 2022-06-19 DIAGNOSIS — R195 Other fecal abnormalities: Secondary | ICD-10-CM

## 2022-06-19 DIAGNOSIS — Z1211 Encounter for screening for malignant neoplasm of colon: Secondary | ICD-10-CM

## 2022-06-19 DIAGNOSIS — K921 Melena: Secondary | ICD-10-CM

## 2022-06-19 DIAGNOSIS — R921 Mammographic calcification found on diagnostic imaging of breast: Secondary | ICD-10-CM

## 2022-06-19 DIAGNOSIS — R928 Other abnormal and inconclusive findings on diagnostic imaging of breast: Secondary | ICD-10-CM

## 2022-06-19 DIAGNOSIS — E049 Nontoxic goiter, unspecified: Secondary | ICD-10-CM

## 2022-06-19 DIAGNOSIS — O039 Complete or unspecified spontaneous abortion without complication: Secondary | ICD-10-CM

## 2022-06-19 DIAGNOSIS — K644 Residual hemorrhoidal skin tags: Secondary | ICD-10-CM

## 2022-06-19 DIAGNOSIS — N9089 Other specified noninflammatory disorders of vulva and perineum: Secondary | ICD-10-CM

## 2022-06-19 DIAGNOSIS — Z01419 Encounter for gynecological examination (general) (routine) without abnormal findings: Secondary | ICD-10-CM

## 2022-06-19 DIAGNOSIS — F419 Anxiety disorder, unspecified: Secondary | ICD-10-CM

## 2022-06-30 IMAGING — MG DIGITAL DIAGNOSTIC BILAT W/ TOMO W/ CAD
6 of 10 series · 6 of 26 positions shown · non-contrast
Comparison: Previous exams.

CLINICAL DATA: Screening recall for possible right breast asymmetry
and possible left breast calcifications.

EXAM:
DIGITAL DIAGNOSTIC BILATERAL MAMMOGRAM WITH TOMOSYNTHESIS AND CAD;
ULTRASOUND RIGHT BREAST LIMITED
TECHNIQUE: Bilateral digital diagnostic mammography and breast tomosynthesis
was performed. The images were evaluated with computer-aided
detection.; Targeted ultrasound examination of the right breast was
performed

[L CC]
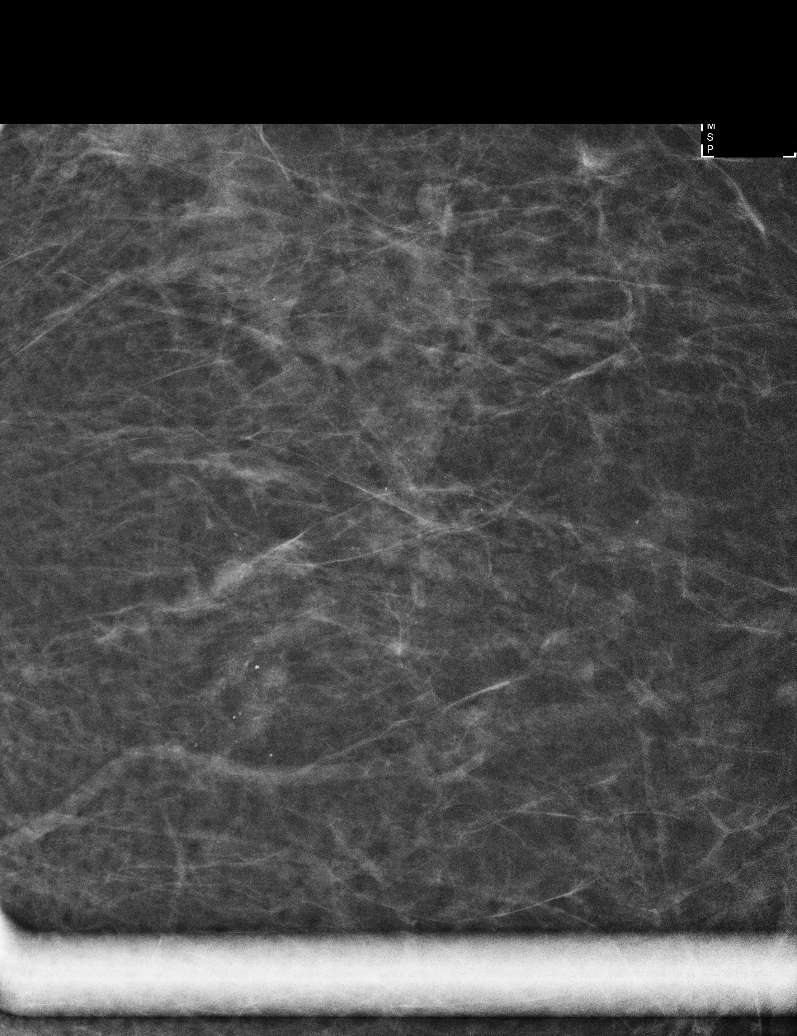

[L ML]
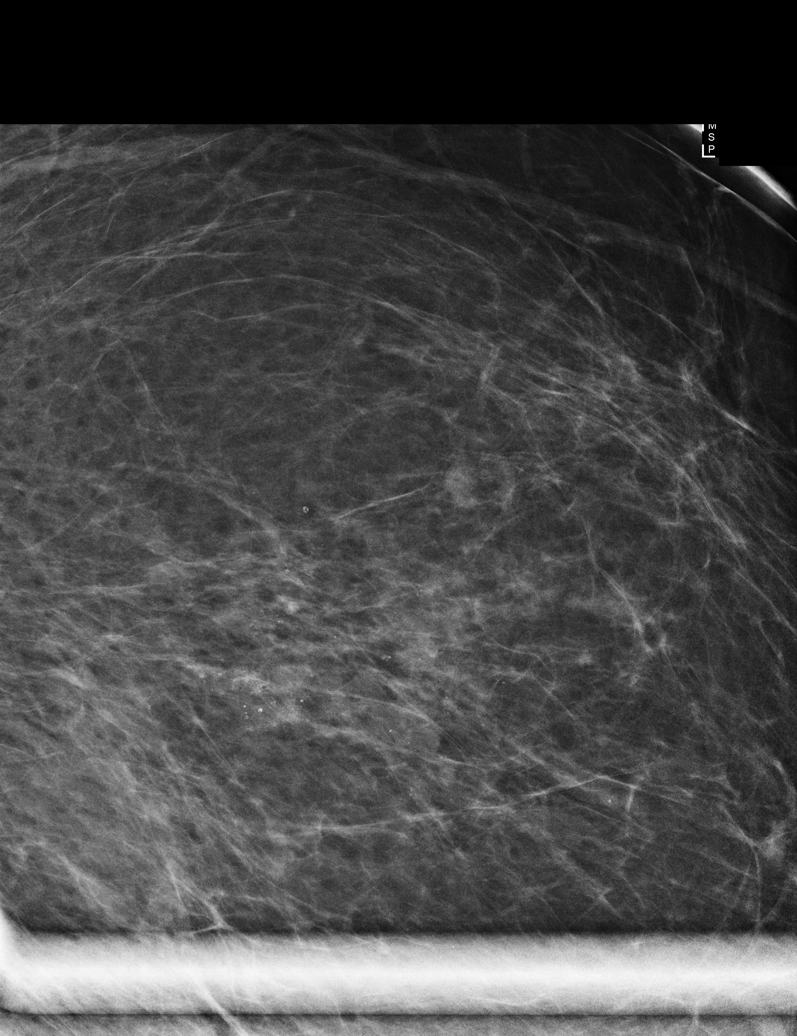

[R CC synth-2D (1 of 2)]
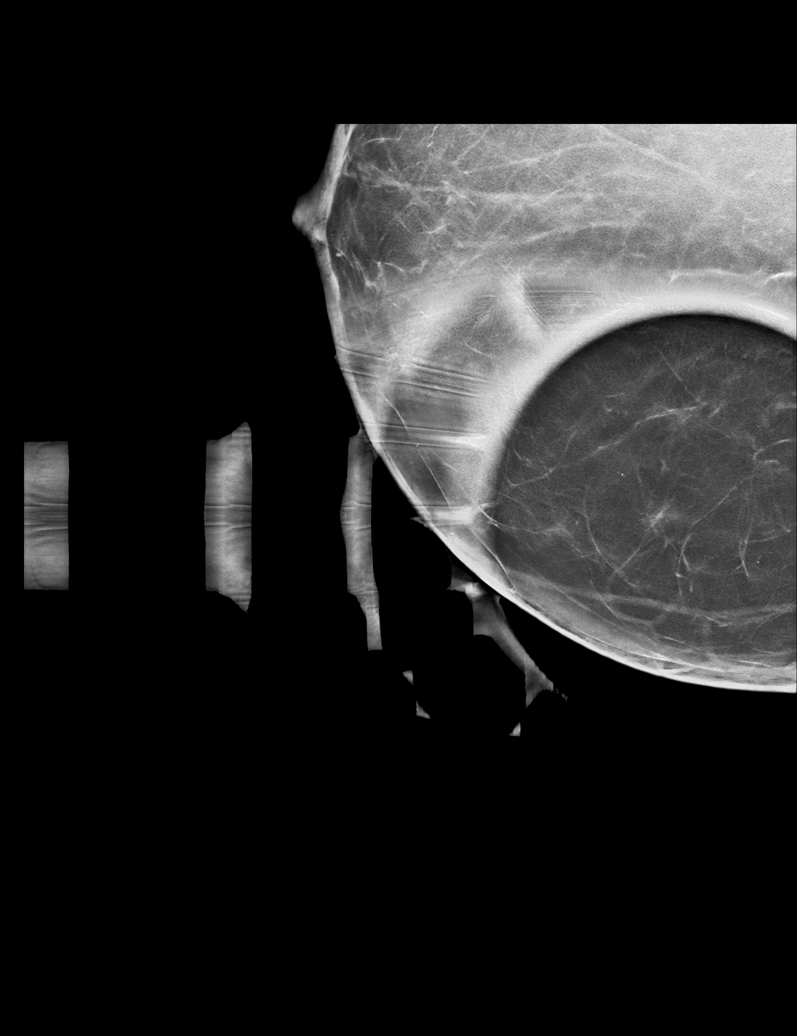

[L ML synth-2D]
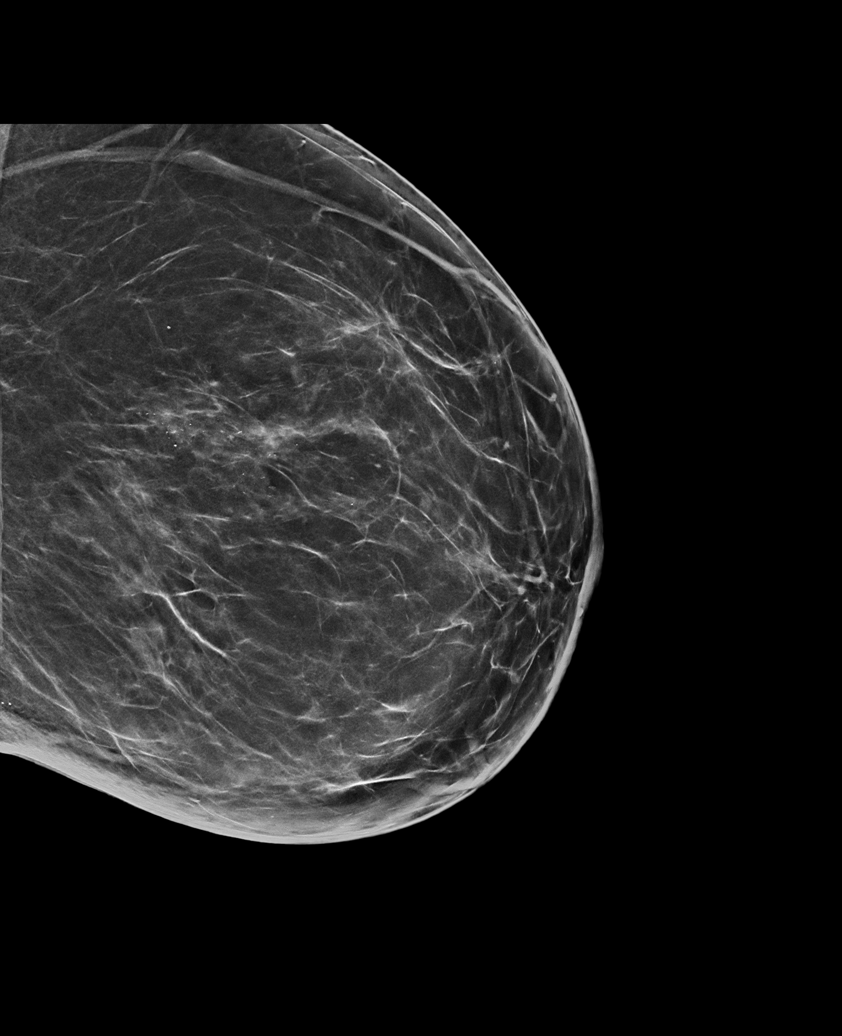

[R ML synth-2D]
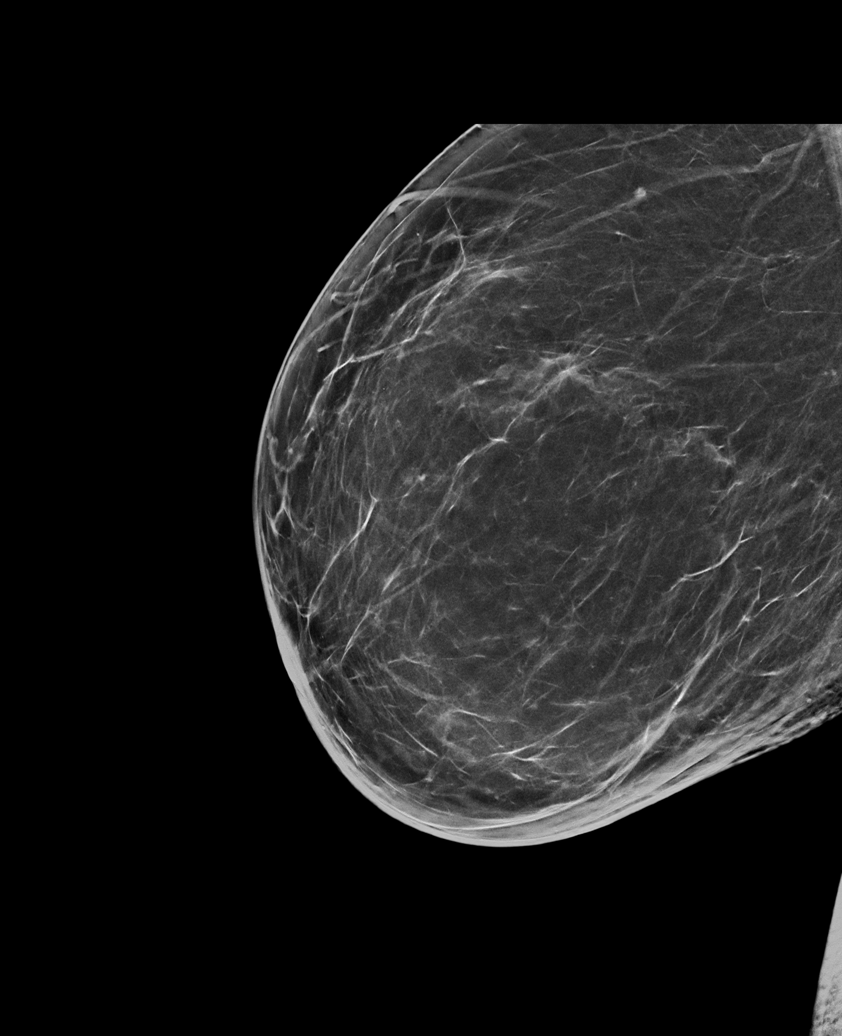

[R CC synth-2D (2 of 2)]
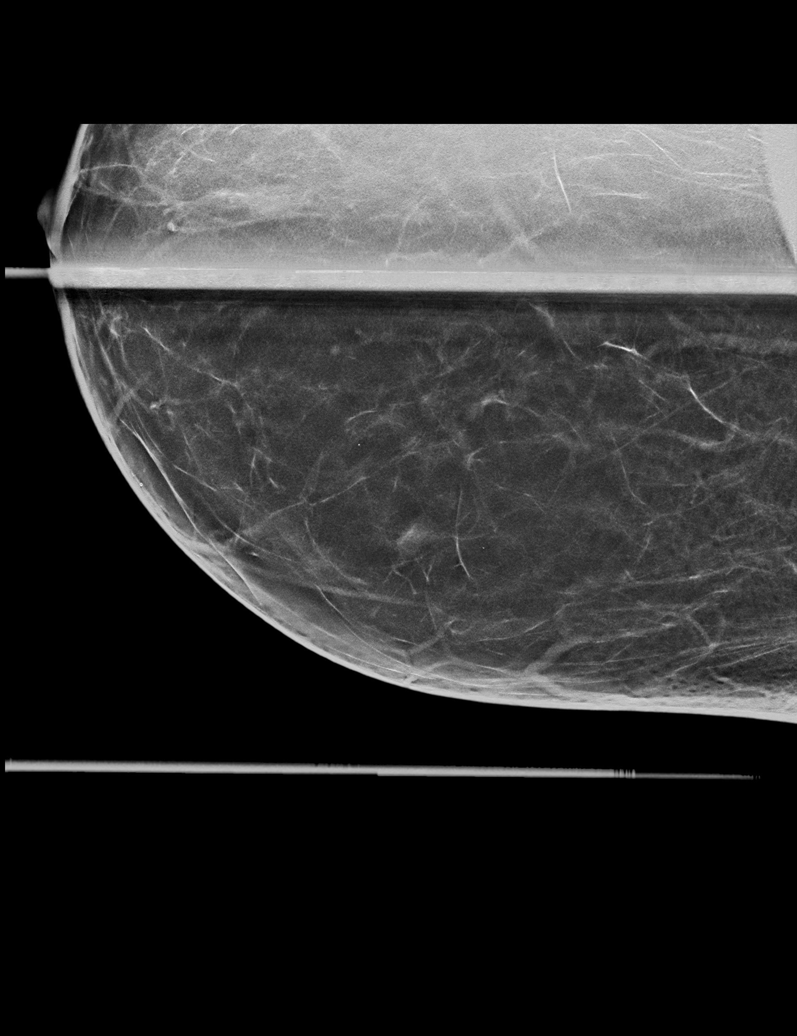

[6 of 26 positions shown; findings below may reference images not displayed]

ACR Breast Density Category b: There are scattered areas of
fibroglandular density.
FINDINGS: Spot compression tomograms were performed of the right breast
demonstrating a persistent low density asymmetry in the far inner
right breast measuring approximately 0.7 cm. Spot compression were
performed over the upper central left breast demonstrating scattered
round and punctate calcifications, some of which layer on the full
paddle ML tomograms. Findings are suggestive of benign milk of
calcium.

Targeted ultrasound of the entire inner right breast was performed
demonstrating few scattered areas of fibrocystic change. A small
mildly complicated cyst in the right breast at 4 o'clock 6 cm from
nipple measures 0.6 x 0.2 x 0.6 cm. This may correspond with the
asymmetry seen in the inner right breast.
IMPRESSION: 1.  Probably benign right breast mass/asymmetry.

2.  Probably benign left breast

RECOMMENDATION:
Recommend calcifications. Bilateral diagnostic mammography with
magnification views of the left breast and ultrasound of the right
breast in 6 months.

I have discussed the findings and recommendations with the patient.
If applicable, a reminder letter will be sent to the patient
regarding the next appointment.

BI-RADS CATEGORY  3: Probably benign.

## 2022-07-14 ENCOUNTER — Other Ambulatory Visit: Payer: Self-pay | Admitting: Surgery

## 2022-07-14 DIAGNOSIS — N6489 Other specified disorders of breast: Secondary | ICD-10-CM

## 2022-10-22 ENCOUNTER — Other Ambulatory Visit: Payer: Self-pay

## 2022-10-22 ENCOUNTER — Ambulatory Visit
Admission: EM | Admit: 2022-10-22 | Discharge: 2022-10-22 | Disposition: A | Discharge status: Home / Self Care | Payer: BC Managed Care – PPO

## 2022-10-22 DIAGNOSIS — I959 Hypotension, unspecified: Secondary | ICD-10-CM

## 2022-10-22 DIAGNOSIS — R42 Dizziness and giddiness: Secondary | ICD-10-CM

## 2022-10-22 DIAGNOSIS — E1165 Type 2 diabetes mellitus with hyperglycemia: Secondary | ICD-10-CM | POA: Diagnosis not present

## 2022-10-22 DIAGNOSIS — R739 Hyperglycemia, unspecified: Secondary | ICD-10-CM

## 2022-10-22 LAB — POCT URINALYSIS DIP (MANUAL ENTRY)
Glucose, UA: >=1000 mg/dL — AB
Leukocytes, UA: NEGATIVE
Nitrite, UA: NEGATIVE
Protein Ur, POC: 100 mg/dL — AB
Spec Grav, UA: 1.015 (ref 1.010–1.025)
Urobilinogen, UA: 0.2 E.U./dL
pH, UA: 5.5 (ref 5.0–8.0)

## 2022-10-22 LAB — POCT FASTING CBG KUC MANUAL ENTRY: POCT Glucose (KUC): 201 mg/dL — AB (ref 70–99)

## 2022-10-22 NOTE — Discharge Instructions (Signed)
Hold your blood pressure medication until you are able to talk with primary care as you have had low readings for the past 2 days.  Make sure to be drinking lots of water and reducing your carbohydrate, sugary foods intake as your blood sugars are running too high.  Go to the emergency department if your symptoms significantly worsen.

## 2022-10-22 NOTE — ED Provider Notes (Signed)
[facility]    CSN: [id number] Arrival date & time: 10/22/22  1217      History   Chief Complaint No chief complaint on file.   HPI Catherine Graves is a 51 y.o. female.   Patient presenting today with 2-day history of hot flashes, diaphoresis, feeling like she is about to pass out, dry mouth.  Had her blood pressure and blood sugar checked yesterday at work and her blood pressure was 92/57 and her blood sugar was around the 200s range.  She denies chest pain, palpitations, shortness of breath, severe dizziness, headache, vomiting, diarrhea.  The only new medication change recently is that she just started last night on double the dose of her metformin but symptoms started prior to this.  She does take medication for high blood pressure, amlodipine and lisinopril HCTZ.  Has never had any issues with low blood pressures on these medications.  Feels like she is eating and drinking normally and no recent illnesses.    Past Medical History:  Diagnosis Date   Anxiety    Constipation 05/22/2016   Diabetes mellitus without complication (HCC)    Goiter 05/22/2016   Hypertension    chronic   Polycystic ovarian disease    Vulvar irritation 05/22/2016    Patient Active Problem List   Diagnosis Date Noted   Fecal occult blood test positive 11/28/2021   Encounter for screening fecal occult blood testing 11/28/2021   Hematochezia 12/22/2019   Hemorrhoidal skin tag 12/22/2019   Screening for colorectal cancer 10/07/2019   Encounter for gynecological examination with Papanicolaou smear of cervix 10/07/2019   Hemorrhoids 12/10/2017   Encounter for well woman exam with routine gynecological exam 12/10/2017   Constipation 05/22/2016   Vulvar irritation 05/22/2016   Goiter 05/22/2016   Miscarriage 06/20/2013   Anxiety 03/29/2013    Past Surgical History:  Procedure Laterality Date   CHOLECYSTECTOMY     thyroid removed      OB History     Gravida  1   Para       Term      Preterm      AB  1   Living         SAB  1   IAB      Ectopic      Multiple      Live Births               Home Medications    Prior to Admission medications   Medication Sig Start Date End Date Taking? Authorizing Provider  lisinopril-hydrochlorothiazide (ZESTORETIC) 10-12.5 MG tablet Take 1 tablet by mouth daily.   Yes [provider]  amLODipine (NORVASC) 5 MG tablet Take 5 mg by mouth daily.  03/25/16   [provider]  atorvastatin (LIPITOR) 20 MG tablet TAKE (1) TABLET BY MOUTH AT BEDTIME. 04/05/19   [provider]  cetirizine (ZYRTEC) 10 MG tablet Take by mouth. Patient not taking: Reported on 11/28/2021    [provider]  doxycycline (VIBRAMYCIN) 100 MG capsule Take 1 capsule (100 mg total) by mouth 2 (two) times daily. Patient not taking: Reported on 11/28/2021 05/08/21   Moshe Cipro, NP  empagliflozin (JARDIANCE) 25 MG TABS tablet Take by mouth. 08/01/19   [provider]  fluticasone (FLONASE) 50 MCG/ACT nasal spray 1 spray by Both Nostrils route daily. Patient not taking: Reported on 11/28/2021    [provider]  hydrochlorothiazide (MICROZIDE) 12.5 MG capsule TAKE 1 CAPSULE BY MOUTH  ONCE A DAY. 03/28/19   [provider]  levothyroxine (SYNTHROID) 175 MCG tablet Take by mouth. 08/18/19   [provider]  metFORMIN (GLUCOPHAGE-XR) 500 MG 24 hr tablet TAKE 1 TABLET BY MOUTH DAILY 03/03/19   [provider]  Vitamin D, Ergocalciferol, (DRISDOL) 50000 units CAPS capsule Take 50,000 Units by mouth every 7 (seven) days.    [provider]    Family History Family History  Problem Relation Age of Onset   Cancer Mother    Diabetes Mother    Diabetes Other    CAD Other    Hypertension Other     Social History Social History   Tobacco Use   Smoking status: Every Day    Packs/day: 0.10    Years: 23.00    Total pack years: 2.30    Types: Cigarettes    Smokeless tobacco: Never   Tobacco comments:    smokes 1 cig daily  Vaping Use   Vaping Use: Never used  Substance Use Topics   Alcohol use: Yes    Comment: very rarely   Drug use: No     Allergies   Sulfa antibiotics and Bactrim ds [sulfamethoxazole-trimethoprim]   Review of Systems Review of Systems Per HPI  Physical Exam Triage Vital Signs ED Triage Vitals  Enc Vitals Group     BP 10/22/22 1252 104/69     Pulse Rate 10/22/22 1252 (!) 113     Resp 10/22/22 1252 18     Temp 10/22/22 1252 98.1 F (36.7 C)     Temp Source 10/22/22 1252 Oral     SpO2 10/22/22 1252 98 %     Weight --      Height --      Head Circumference --      Peak Flow --      Pain Score 10/22/22 1300 0     Pain Loc --      Pain Edu? --      Excl. in GC? --    Orthostatic VS for the past 24 hrs:  BP- Lying Pulse- Lying BP- Sitting Pulse- Sitting BP- Standing at 0 minutes Pulse- Standing at 0 minutes  10/22/22 1351 99/65 112 105/66 115 95/64 116    Updated Vital Signs BP 104/69 (BP Location: Right Arm)   Pulse (!) 113   Temp 98.1 F (36.7 C) (Oral)   Resp 18   SpO2 98%   Visual Acuity Right Eye Distance:   Left Eye Distance:   Bilateral Distance:    Right Eye Near:   Left Eye Near:    Bilateral Near:     Physical Exam Vitals and nursing note reviewed.  Constitutional:      Appearance: Normal appearance. She is not ill-appearing.  HENT:     Head: Atraumatic.     Nose: Nose normal.     Mouth/Throat:     Comments: Mucous membranes mildly dry Eyes:     Extraocular Movements: Extraocular movements intact.     Conjunctiva/sclera: Conjunctivae normal.  Cardiovascular:     Rate and Rhythm: Normal rate and regular rhythm.     Heart sounds: Normal heart sounds.  Pulmonary:     Effort: Pulmonary effort is normal.     Breath sounds: Normal breath sounds. No wheezing or rales.  Musculoskeletal:        General: Normal range of motion.     Cervical back: Normal range of motion and  neck supple.  Skin:    General:  Skin is warm and dry.  Neurological:     General: No focal deficit present.     Mental Status: She is alert and oriented to person, place, and time. Mental status is at baseline.     Motor: No weakness.     Gait: Gait normal.  Psychiatric:        Mood and Affect: Mood normal.        Thought Content: Thought content normal.        Judgment: Judgment normal.      UC Treatments / Results  Labs (all labs ordered are listed, but only abnormal results are displayed) Labs Reviewed  POCT FASTING CBG [facility] MANUAL ENTRY - Abnormal; Notable for the following components:      Result Value   POCT Glucose ([facility]) 201 (*)    All other components within normal limits  POCT URINALYSIS DIP (MANUAL ENTRY) - Abnormal; Notable for the following components:   Glucose, UA >=1,000 (*)    Bilirubin, UA small (*)    Ketones, POC UA trace (5) (*)    Blood, UA trace-intact (*)    Protein Ur, POC =100 (*)    All other components within normal limits    EKG   Radiology No results found.  Procedures Procedures (including critical care time)  Medications Ordered in UC Medications - No data to display  Initial Impression / Assessment and Plan / UC Course  I have reviewed the triage vital signs and the nursing notes.  Pertinent labs & imaging results that were available during my care of the patient were reviewed by me and considered in my medical decision making (see chart for details).     Blood pressure today is on the low end of normal, mildly tachycardic in triage, otherwise vital signs reassuring.  EKG today showing sinus tachycardia with no ST elevations, no previous EKGs for comparison today.  Her point-of-care glucose is elevated around 200, she is unsure what her baseline is as yesterday was the first time she had checked it in a very long time.  Her urine shows some possible mild dehydration today.  Her orthostatic vital signs were negative for significant  change positionally.  Discussed with patient to hold her blood pressure medications until able to speak with primary care for further instructions.  Increase fluid intake, strictly monitor dietary intake as I do suspect the elevation in blood sugar is at least partially responsible for some mild dehydration.  Discussed going to the emergency department for worsening symptoms at any time.  50 minutes spent today in direct patient care, education, evaluation.  Final Clinical Impressions(s) / UC Diagnoses   Final diagnoses:  Hypotension, unspecified hypotension type  Lightheadedness  Hyperglycemia     Discharge Instructions      Hold your blood pressure medication until you are able to talk with primary care as you have had low readings for the past 2 days.  Make sure to be drinking lots of water and reducing your carbohydrate, sugary foods intake as your blood sugars are running too high.  Go to the emergency department if your symptoms significantly worsen.    ED Prescriptions   None    PDMP not reviewed this encounter.   Particia Nearing, [place] 10/22/22 1535

## 2022-10-22 NOTE — ED Triage Notes (Signed)
Pt reports have dark and hot spells. It comes and goes every couple of hrs since yesterday morning. Says she had her bp checked and it was low . Coworker told pt she appeared pale.  Says her only med change is metformin 500 mg.  (92/57) Pt is on bp meds the night before.    Reports she feel woozy  Pt is unsure about linsinapril

## 2022-11-19 ENCOUNTER — Ambulatory Visit (INDEPENDENT_AMBULATORY_CARE_PROVIDER_SITE_OTHER): Payer: BC Managed Care – PPO | Admitting: Adult Health

## 2022-11-19 ENCOUNTER — Encounter: Payer: Self-pay | Admitting: Adult Health

## 2022-11-19 VITALS — BP 113/80 | HR 126 | Ht 63.0 in | Wt 224.0 lb

## 2022-11-19 DIAGNOSIS — L0291 Cutaneous abscess, unspecified: Secondary | ICD-10-CM | POA: Insufficient documentation

## 2022-11-19 DIAGNOSIS — L0232 Furuncle of buttock: Secondary | ICD-10-CM | POA: Diagnosis not present

## 2022-11-19 MED ORDER — FLUCONAZOLE 150 MG PO TABS: ORAL_TABLET | ORAL | 1 refills | Status: AC

## 2022-11-19 MED ORDER — CEPHALEXIN 500 MG PO CAPS: 500.0000 mg | ORAL_CAPSULE | Freq: Four times a day (QID) | ORAL | 0 refills | Status: DC

## 2022-11-19 NOTE — Progress Notes (Signed)
  Subjective:     Patient ID: Catherine Graves, female   DOB: 1971/02/03, 51 y.o.   MRN: [id number]  HPI Catherine Graves is a 51 year old white female, divorced, G1P0010, worked in for ? Boil left buttock, started Sunday.  Last pap was negative HPV and malignancy 10/07/2019  Review of Systems Has ?boil left buttock started Sunday,firm and tender, feels bigger at times then goes down  Reviewed past medical,surgical, social and family history. Reviewed medications and allergies.     Objective:   Physical Exam BP 113/80 (BP Location: Left Arm, Patient Position: Sitting, Cuff Size: Large)   Pulse (!) 126   Ht 5\' 3"  (1.6 m)   Wt 224 lb (101.6 kg)   BMI 39.68 kg/m     Skin is warm and dry, has 6 x 10 cm slightly red firm area left buttock, no induration, Dr in for co exam. Fall risk is low  Upstream - 11/19/22 1423       Pregnancy Intention Screening   Does the patient want to become pregnant in the next year? No    Does the patient's partner want to become pregnant in the next year? No    Would the patient like to discuss contraceptive options today? No      Contraception Wrap Up   Current Method No Method - Other Reason    Reason for No Current Contraceptive Method at Intake ([facility] Only) Other    End Method No Method - Other Reason            Examination chaperoned by 11/21/22 LPN  Assessment:     1. Boil of buttock Has 6 x 10 cm slightly red firm area left buttock, no induration  Use warm compresses Will rx keflex and diflucan  Meds ordered this encounter  Medications   cephALEXin (KEFLEX) 500 MG capsule    Sig: Take 1 capsule (500 mg total) by mouth 4 (four) times daily for 10 days.    Dispense:  40 capsule    Refill:  0    Order Specific Question:   Supervising Provider    Answer:   Malachy Mood, LUTHER H [[id number]]   fluconazole (DIFLUCAN) 150 MG tablet    Sig: Take 1 now and 1 in 3 days    Dispense:  2 tablet    Refill:  1    Order Specific Question:   Supervising Provider     Answer:   Despina Hidden H [[id number]]   Can  take tylenol and ibuprofen together for pain     Plan:     Follow up in 8 days, if feeling better will do pap then

## 2022-11-27 ENCOUNTER — Other Ambulatory Visit (HOSPITAL_COMMUNITY)
Admission: RE | Admit: 2022-11-27 | Discharge: 2022-11-27 | Disposition: A | Discharge status: Home / Self Care | Payer: BC Managed Care – PPO | Source: Ambulatory Visit | Attending: Adult Health | Admitting: Adult Health

## 2022-11-27 ENCOUNTER — Ambulatory Visit (INDEPENDENT_AMBULATORY_CARE_PROVIDER_SITE_OTHER): Payer: BC Managed Care – PPO | Admitting: Adult Health

## 2022-11-27 ENCOUNTER — Encounter: Payer: Self-pay | Admitting: Adult Health

## 2022-11-27 VITALS — BP 129/84 | HR 112 | Ht 63.0 in | Wt 228.0 lb

## 2022-11-27 DIAGNOSIS — Z124 Encounter for screening for malignant neoplasm of cervix: Secondary | ICD-10-CM | POA: Diagnosis present

## 2022-11-27 DIAGNOSIS — L0232 Furuncle of buttock: Secondary | ICD-10-CM | POA: Diagnosis not present

## 2022-11-27 MED ORDER — DOXYCYCLINE HYCLATE 100 MG PO TABS: 100.0000 mg | ORAL_TABLET | Freq: Two times a day (BID) | ORAL | 0 refills | Status: AC

## 2022-11-27 NOTE — Progress Notes (Signed)
  Subjective:     Patient ID: Catherine Graves, female   DOB: 11-Sep-1971, 51 y.o.   MRN: [id number]  HPI Catherine Graves is a 51 year old white female,sivorcedm G1P0010, back in follow up on boil, was seen 12.20.23 and started on Keflex, and is feeling much better,it has popped and is draining. Will do pap today.  PCP is Marin Olp NP  Review of Systems Feels much better Boil has popped and is draining  Reviewed past medical,surgical, social and family history. Reviewed medications and allergies.     Objective:   Physical Exam BP 129/84 (BP Location: Left Arm, Patient Position: Sitting, Cuff Size: Normal)   Pulse (!) 112   Ht 5\' 3"  (1.6 m)   Wt 228 lb (103.4 kg)   BMI 40.39 kg/m     Skin warm and dry.Pelvic: external genitalia is normal in appearance, the boil is smaller 3 x 4 cm now, still red and is draining clear fluid, vagina:pink,urethra has no lesions or masses noted, cervix:smooth,pap with HR HPV genotyping performed, uterus: normal size, shape and contour, non tender, no masses felt, adnexa: no masses or tenderness noted. Bladder is non tender and no masses felt.   Upstream - 11/27/22 1418       Pregnancy Intention Screening   Does the patient want to become pregnant in the next year? No    Does the patient's partner want to become pregnant in the next year? No    Would the patient like to discuss contraceptive options today? No      Contraception Wrap Up   Current Method No Method - Other Reason    End Method No Method - Other Reason    Contraception Counseling Provided No            Examination chaperoned by 11/29/22 LPN  Assessment:     1. Routine Papanicolaou smear Pap sent Pap in 3 years if normal   2. Boil of buttock Has gotten small, and less painful, still slightly red and has firmness and draining some Continue warm compresses  Will add doxycycline Meds ordered this encounter  Medications   doxycycline (VIBRA-TABS) 100 MG tablet    Sig: Take 1  tablet (100 mg total) by mouth 2 (two) times daily.    Dispense:  20 tablet    Refill:  0    Order Specific Question:   Supervising Provider    Answer:   Faith Rogue [[id number]]      Plan:     Follow up in 2 weeks for recheck

## 2022-12-04 ENCOUNTER — Encounter: Payer: Self-pay | Admitting: Adult Health

## 2022-12-04 DIAGNOSIS — R87610 Atypical squamous cells of undetermined significance on cytologic smear of cervix (ASC-US): Secondary | ICD-10-CM | POA: Insufficient documentation

## 2022-12-04 LAB — CYTOLOGY - PAP
Comment: NEGATIVE
Diagnosis: UNDETERMINED — AB
High risk HPV: NEGATIVE

## 2022-12-11 ENCOUNTER — Ambulatory Visit: Payer: BC Managed Care – PPO | Admitting: Adult Health

## 2023-01-21 ENCOUNTER — Other Ambulatory Visit: Payer: Self-pay | Admitting: Adult Health
